# Patient Record
Sex: Female | Born: 1980
Health system: Southern US, Community
[De-identification: ages and names within clinical notes are randomized; demographics above are authoritative.]

## PROBLEM LIST (undated history)

## (undated) DIAGNOSIS — F419 Anxiety disorder, unspecified: Secondary | ICD-10-CM

## (undated) DIAGNOSIS — Z973 Presence of spectacles and contact lenses: Secondary | ICD-10-CM

## (undated) DIAGNOSIS — F32A Depression, unspecified: Secondary | ICD-10-CM

## (undated) DIAGNOSIS — F329 Major depressive disorder, single episode, unspecified: Secondary | ICD-10-CM

## (undated) DIAGNOSIS — G43909 Migraine, unspecified, not intractable, without status migrainosus: Secondary | ICD-10-CM

## (undated) DIAGNOSIS — E079 Disorder of thyroid, unspecified: Secondary | ICD-10-CM

## (undated) HISTORY — DX: Depression, unspecified: F32.A

## (undated) HISTORY — PX: GALLBLADDER SURGERY: SHX652

## (undated) HISTORY — PX: CHOLECYSTECTOMY: SHX55

## (undated) HISTORY — DX: Anxiety disorder, unspecified: F41.9

## (undated) HISTORY — DX: Major depressive disorder, single episode, unspecified: F32.9

---

## 2004-07-17 ENCOUNTER — Observation Stay: Payer: Self-pay

## 2004-08-20 ENCOUNTER — Inpatient Hospital Stay: Payer: Self-pay

## 2011-10-11 ENCOUNTER — Ambulatory Visit: Payer: Self-pay | Admitting: Physician Assistant

## 2012-01-15 ENCOUNTER — Emergency Department: Payer: Self-pay | Admitting: Emergency Medicine

## 2012-01-15 LAB — URINALYSIS, COMPLETE
Bilirubin,UR: NEGATIVE
Blood: NEGATIVE
Ketone: NEGATIVE
Leukocyte Esterase: NEGATIVE
Specific Gravity: 1.014 (ref 1.003–1.030)
Squamous Epithelial: 1
WBC UR: 4 /HPF (ref 0–5)

## 2012-01-15 LAB — COMPREHENSIVE METABOLIC PANEL
Albumin: 3.9 g/dL (ref 3.4–5.0)
Alkaline Phosphatase: 236 U/L — ABNORMAL HIGH (ref 50–136)
Calcium, Total: 8.8 mg/dL (ref 8.5–10.1)
Co2: 27 mmol/L (ref 21–32)
EGFR (Non-African Amer.): >60
Glucose: 109 mg/dL — ABNORMAL HIGH (ref 65–99)
Osmolality: 275 (ref 275–301)
SGOT(AST): 39 U/L — ABNORMAL HIGH (ref 15–37)
SGPT (ALT): 62 U/L (ref 12–78)
Sodium: 138 mmol/L (ref 136–145)

## 2012-01-15 LAB — CBC
HGB: 13.8 g/dL (ref 12.0–16.0)
MCH: 31 pg (ref 26.0–34.0)
MCHC: 34.1 g/dL (ref 32.0–36.0)
MCV: 91 fL (ref 80–100)
RDW: 12.7 % (ref 11.5–14.5)

## 2012-02-10 ENCOUNTER — Ambulatory Visit: Payer: Self-pay | Admitting: Surgery

## 2012-02-10 LAB — CBC WITH DIFFERENTIAL/PLATELET
Basophil #: 0.1 10*3/uL (ref 0.0–0.1)
Basophil %: 1.3 %
HCT: 41.6 % (ref 35.0–47.0)
Lymphocyte #: 2.3 10*3/uL (ref 1.0–3.6)
Lymphocyte %: 21.7 %
MCH: 31 pg (ref 26.0–34.0)
MCHC: 34.8 g/dL (ref 32.0–36.0)
MCV: 89 fL (ref 80–100)
Neutrophil #: 6.9 10*3/uL — ABNORMAL HIGH (ref 1.4–6.5)
Platelet: 346 10*3/uL (ref 150–440)
RDW: 12.7 % (ref 11.5–14.5)
WBC: 10.8 10*3/uL (ref 3.6–11.0)

## 2012-02-10 LAB — COMPREHENSIVE METABOLIC PANEL
Alkaline Phosphatase: 150 U/L — ABNORMAL HIGH (ref 50–136)
Anion Gap: 11 (ref 7–16)
BUN: 11 mg/dL (ref 7–18)
Calcium, Total: 8.6 mg/dL (ref 8.5–10.1)
Co2: 21 mmol/L (ref 21–32)
Creatinine: 0.7 mg/dL (ref 0.60–1.30)
EGFR (Non-African Amer.): >60
Glucose: 98 mg/dL (ref 65–99)
Potassium: 3.6 mmol/L (ref 3.5–5.1)
Sodium: 139 mmol/L (ref 136–145)

## 2012-02-11 LAB — PATHOLOGY REPORT

## 2014-06-11 NOTE — H&P (Signed)
PATIENT NAME:  Vickie OxfordFOGLEMAN, Raquel E MR#:  [id number] DATE OF BIRTH:  1980/03/24  DATE OF ADMISSION:  02/10/2012  DATE OF OPERATION:  02/10/2012  CHIEF COMPLAINT:  Right upper quadrant pain.   HISTORY OF PRESENT ILLNESS:  This is a 34 year old Caucasian female patient who has had multiple episodes of right upper quadrant pain associated with fatty food intolerance. Her workup shows gallstones without acute cholecystitis. The patient is actually a patient of Dr. Marlowe KaysEly's and has been seeing him for consideration of surgery. Dr. Michela PitcherEly asked me to perform the operation as he is not available on the specific day that the patient requested surgery. I will meet the patient in the preop holding area and go over the history and physical as well. This history and physical is being dictated from Dr. Marlowe KaysEly's office notes.   PAST MEDICAL HISTORY:  None.   PAST SURGICAL HISTORY:  None.   ALLERGIES:  None.   MEDICATIONS:  None.   SOCIAL HISTORY:  The patient does not smoke or drink.   REVIEW OF SYSTEMS:  A 10 system review is performed and negative with the exception of that mentioned in the HPI and documented in Dr. Marlowe KaysEly's chart.   PHYSICAL EXAMINATION: GENERAL: Healthy-appearing female patient, BMI is 30.  VITAL SIGNS: Stable.  HEENT: No scleral icterus.  NECK: No palpable neck nodes.  CHEST: Clear to auscultation.  CARDIAC: Regular rate and rhythm.  ABDOMEN: Soft, nontender.  EXTREMITIES: Without edema. Calves are nontender.  NEUROLOGIC: Grossly intact.  INTEGUMENT: No jaundice.   LABORATORY, DIAGNOSTIC AND RADIOLOGICAL DATA:  An ultrasound shows gallstones. A HIDA scan was normal without an obstructed duct. Recent laboratory values demonstrate a white blood cell count of 11.8, H and H of 13.8 and 40 with a platelet count of 319. AST and ALT are within normal limits with an elevated alkaline phosphatase.   ASSESSMENT AND PLAN:  This is a patient with symptomatic cholelithiasis. She has had multiple  attacks. Dr. Michela PitcherEly has asked me to perform surgery in his absence. The rationale for this has been discussed between Dr. Michela PitcherEly and myself. Dr. Michela PitcherEly has reviewed the options and the risks of bleeding and infection, etc., and I will discuss those same options, rationale and risks with the patient in the preop holding area.    ____________________________ Adah Salvageichard E. Excell Seltzerooper, MD rec:si D: 02/09/2012 22:20:00 ET T: 02/09/2012 23:28:16 ET JOB#: [id number]  cc: Adah Salvageichard E. Excell Seltzerooper, MD, <Dictator> Lattie HawICHARD E Camari Wisham MD ELECTRONICALLY SIGNED 02/10/2012 7:34

## 2014-06-11 NOTE — Op Note (Signed)
PATIENT NAME:  Vickie Gilbert, Vickie Gilbert MR#:  [id number] DATE OF BIRTH:  [birth year]  DATE OF PROCEDURE:  02/10/2012  PREOPERATIVE DIAGNOSIS: Symptomatic cholelithiasis.   POSTOPERATIVE DIAGNOSIS: Symptomatic cholelithiasis.   PROCEDURE: Laparoscopic cholecystectomy.   SURGEON: Phoebe Perch, M.D.   ANESTHESIA: General with endotracheal tube.   INDICATIONS: This is a patient with recurrent episodes of right upper quadrant pain associated with fatty food intolerance and nausea and a workup showing gallstones. Dr. Pat Patrick had seen the patient in the office and asked that I perform surgery in his absence. I met the patient in the preop holding area and we discussed the rationale for surgery, the options of observation, and the risks of bleeding, infection, recurrence of symptoms, failure to resolve her symptoms, open procedure, bile duct damage, bile duct leak and retained common bile duct stone any of which could require further surgery and/or ERCP, stent and papillotomy. This was all reviewed for she and her husband. They understood and agreed to proceed.   FINDINGS: Small gallstones, scar on the anterior surface of the gallbladder.   DESCRIPTION OF PROCEDURE: The patient was induced to general anesthesia. VTE prophylaxis was in place. IV antibiotics were given. She was then prepped and draped in sterile fashion. Marcaine was infiltrated in skin and subcutaneous tissues around the supraumbilical area avoiding a prior piercing. An incision was made, Veress needle was placed, pneumoperitoneum was obtained and a 5 mm trocar port was placed. The abdominal cavity was explored and under direct vision a 10 mm epigastric port and two lateral 5 mm ports were placed. The gallbladder was placed on tension. The peritoneum over the infundibulum was incised bluntly after taking down some adhesions bluntly. The cystic duct/gallbladder junction was well identified. The cystic artery was doubly clipped and divided in one branch  to allow for good visualization of the cystic duct as it entered the infundibulum of the gallbladder. Here it was doubly clipped and divided and then a second branch of the cystic artery was doubly clipped and divided and the gallbladder was taken from the gallbladder fossa with electrocautery and passed out through the epigastric port site with the aid of an Endo Catch bag. The area was checked for hemostasis and found to be adequate. There was no sign of bleeding, bile leak or bowel injury. The camera was placed in the epigastric site to view back to the periumbilical site. There was no sign of adhesions or bowel injury. Therefore, pneumoperitoneum was released, all ports were removed, fascial edges of the epigastric site were approximated with 0 Vicryl and 4-0 subcuticular Monocryl was used on all skin edges and Steri-Strips, Mastisol, and sterile dressings were placed.   The patient tolerated the procedure well. There were no complications. She was taken to the recovery room in stable condition to be discharged in the care of her family and follow up in 10 days. ____________________________ Jerrol Banana Burt Knack, MD rec:sb D: 02/10/2012 08:01:00 ET T: 02/10/2012 14:27:23 ET JOB#: [id number]  cc: Jerrol Banana. Burt Knack, MD, <Dictator> Florene Glen MD ELECTRONICALLY SIGNED 02/10/2012 15:22

## 2015-08-18 ENCOUNTER — Other Ambulatory Visit: Payer: Self-pay | Admitting: *Deleted

## 2015-08-18 MED ORDER — ESCITALOPRAM OXALATE 10 MG PO TABS: 10.0000 mg | ORAL_TABLET | Freq: Every day | ORAL | Status: DC

## 2015-11-06 ENCOUNTER — Other Ambulatory Visit: Payer: Self-pay | Admitting: Obstetrics and Gynecology

## 2015-11-20 ENCOUNTER — Telehealth: Payer: Self-pay | Admitting: Obstetrics and Gynecology

## 2015-11-20 NOTE — Telephone Encounter (Signed)
SHE IS OUT OF THE LEXIPRO BUT SHE WOULD LIKE TO DISCUSS MAYBE CHANGING IT, SHE DIDN'T KNOW IF SHE NEEDED TO MAKE AN APPT OR IF YOU AND HER CAN JUST TALK OVER THE PHONE. SHE STATED THAT HER SISTER IS ON [employer] AND HER SISTER COMES HERE, AND SHE TALKED TO HER AND THEY HAVE THE SAME THING THAT IS GOING ON , AND HER PTS ANXIETY IS GETTING WORSE SO SHE THINKS THAT THE LEXIPRO IS NOT WORKING MAYBE MAKING THINGS WORSE.

## 2015-11-25 NOTE — Telephone Encounter (Signed)
Called pt advised she will need appt, she voiced understanding

## 2015-11-25 NOTE — Telephone Encounter (Signed)
Vickie AddisonKatie called again about her medication.

## 2015-11-25 NOTE — Telephone Encounter (Signed)
She really needs to make an appointment. Doesn't look like we have seen her since on Epic.

## 2015-11-28 ENCOUNTER — Encounter: Payer: Self-pay | Admitting: Obstetrics and Gynecology

## 2015-11-28 ENCOUNTER — Ambulatory Visit (INDEPENDENT_AMBULATORY_CARE_PROVIDER_SITE_OTHER): Payer: Managed Care, Other (non HMO) | Admitting: Obstetrics and Gynecology

## 2015-11-28 VITALS — BP 114/79 | HR 83 | Ht 67.0 in | Wt 215.4 lb

## 2015-11-28 DIAGNOSIS — G479 Sleep disorder, unspecified: Secondary | ICD-10-CM | POA: Diagnosis not present

## 2015-11-28 DIAGNOSIS — F410 Panic disorder [episodic paroxysmal anxiety] without agoraphobia: Secondary | ICD-10-CM

## 2015-11-28 DIAGNOSIS — F331 Major depressive disorder, recurrent, moderate: Secondary | ICD-10-CM | POA: Diagnosis not present

## 2015-11-28 MED ORDER — VENLAFAXINE HCL ER 37.5 MG PO CP24: 37.5000 mg | ORAL_CAPSULE | Freq: Every day | ORAL | 4 refills | Status: DC

## 2015-11-28 MED ORDER — TRIAZOLAM 0.25 MG PO TABS: 0.2500 mg | ORAL_TABLET | Freq: Every evening | ORAL | 4 refills | Status: DC | PRN

## 2015-11-28 NOTE — Progress Notes (Signed)
Subjective:     Patient ID: Vickie Gilbert, female   DOB: 01-28-1981, 35 y.o.   MRN: [id number]  HPI Thought Lexapro wasn't working, but ran out of medication 3 weeks and now realizes it was helping some. But still feels like anxiety wasn't helped by it. Having panic attacks daily. Crying daily, and most of the time not related to stressors. Husband is supportive. Took Lexapro for 3 years postpartum, and then for 2 years currently. Has autoimmune disorder like RA but not actually it, all symptoms are flared up and making her feel worse.  Review of Systems Joint pain,  Malaise, no strength, dizzy, migraines, or generalized headaches,extremeties shaking; Depression screen PHQ 2/9 11/28/2015  Decreased Interest 3  Down, Depressed, Hopeless 3  PHQ - 2 Score 6  Altered sleeping 3  Tired, decreased energy 2  Change in appetite 2  Feeling bad or failure about yourself  2  Trouble concentrating 2  Moving slowly or fidgety/restless 2  Suicidal thoughts 0  PHQ-9 Score 19      Objective:   Physical Exam A&O x4 Well groomed female in distress, crying and frustrated Blood pressure 114/79, pulse 83, height 5\' 7"  (1.702 m), weight 215 lb 6.4 oz (97.7 kg), last menstrual period 11/05/2015. Thought processes intact but scattered     Assessment:     Depression with anxiety Panic attacked Sleep disturbance     Plan:     effexor 37.5mg  started, along with triazolam 0.25mg  at bedtime. Counseled on treatment and side effects and expected outcome. RTC 5 weeks for follow up or as needed.  >50% of 20 minutes spent in counseling.  Carmin Dibartolo BurdettShambley, CNM

## 2015-11-28 NOTE — Patient Instructions (Signed)
Thank you for enrolling in MyChart. Please follow the instructions below to securely access your online medical record. MyChart allows you to send messages to your doctor, view your test results, renew your prescriptions, schedule appointments, and more.  How Do I Sign Up? 1. In your Internet browser, go to [email or web address] 2. Click on the New  User? link in the Sign In box.  3. Enter your MyChart Access Code exactly as it appears below. You will not need to use this code after you have completed the sign-up process. If you do not sign up before the expiration date, you must request a new code. MyChart Access Code: [email or web address] Expires: 01/27/2016  3:51 PM  4. Enter the last four digits of your Social Security Number (xxxx) and Date of Birth (mm/dd/yyyy) as indicated and click Next. You will be taken to the next sign-up page. 5. Create a MyChart ID. This will be your MyChart login ID and cannot be changed, so think of one that is secure and easy to remember. 6. Create a MyChart password. You can change your password at any time. 7. Enter your Password Reset Question and Answer and click Next. This can be used at a later time if you forget your password.  8. Select your communication preference, and if applicable enter your e-mail address. You will receive e-mail notification when new information is available in MyChart by choosing to receive e-mail notifications and filling in your e-mail. 9. Click Sign In. You can now view your medical record.   Additional Information If you have questions, you can email [email or web address] or call [phone] to talk to our MyChart staff. Remember, MyChart is NOT to be used for urgent needs. For medical emergencies, dial 911.     Major Depressive Disorder Major depressive disorder is a mental illness. It also may be called clinical depression or unipolar depression. Major depressive disorder usually causes feelings of  sadness, hopelessness, or helplessness. Some people with this disorder do not feel particularly sad but lose interest in doing things they used to enjoy (anhedonia). Major depressive disorder also can cause physical symptoms. It can interfere with work, school, relationships, and other normal everyday activities. The disorder varies in severity but is longer lasting and more serious than the sadness we all feel from time to time in our lives. Major depressive disorder often is triggered by stressful life events or major life changes. Examples of these triggers include divorce, loss of your job or home, a move, and the death of a family member or close friend. Sometimes this disorder occurs for no obvious reason at all. People who have family members with major depressive disorder or bipolar disorder are at higher risk for developing this disorder, with or without life stressors. Major depressive disorder can occur at any age. It may occur just once in your life (single episode major depressive disorder). It may occur multiple times (recurrent major depressive disorder). SYMPTOMS People with major depressive disorder have either anhedonia or depressed mood on nearly a daily basis for at least 2 weeks or longer. Symptoms of depressed mood include:  Feelings of sadness (blue or down in the dumps) or emptiness.  Feelings of hopelessness or helplessness.  Tearfulness or episodes of crying (may be observed by others).  Irritability (children and adolescents). In addition to depressed mood or anhedonia or both, people with this disorder have at least four of the following symptoms:  Difficulty sleeping or sleeping too much.  Significant change (increase or decrease) in appetite or weight.   Lack of energy or motivation.  Feelings of guilt and worthlessness.   Difficulty concentrating, remembering, or making decisions.  Unusually slow movement (psychomotor retardation) or restlessness (as  observed by others).   Recurrent wishes for death, recurrent thoughts of self-harm (suicide), or a suicide attempt. People with major depressive disorder commonly have persistent negative thoughts about themselves, other people, and the world. People with severe major depressive disorder may experiencedistorted beliefs or perceptions about the world (psychotic delusions). They also may see or hear things that are not real (psychotic hallucinations). DIAGNOSIS Major depressive disorder is diagnosed through an assessment by your health care provider. Your health care provider will ask aboutaspects of your daily life, such as mood,sleep, and appetite, to see if you have the diagnostic symptoms of major depressive disorder. Your health care provider may ask about your medical history and use of alcohol or drugs, including prescription medicines. Your health care provider also may do a physical exam and blood work. This is because certain medical conditions and the use of certain substances can cause major depressive disorder-like symptoms (secondary depression). Your health care provider also may refer you to a mental health specialist for further evaluation and treatment. TREATMENT It is important to recognize the symptoms of major depressive disorder and seek treatment. The following treatments can be prescribed for this disorder:   Medicine. Antidepressant medicines usually are prescribed. Antidepressant medicines are thought to correct chemical imbalances in the brain that are commonly associated with major depressive disorder. Other types of medicine may be added if the symptoms do not respond to antidepressant medicines alone or if psychotic delusions or hallucinations occur.  Talk therapy. Talk therapy can be helpful in treating major depressive disorder by providing support, education, and guidance. Certain types of talk therapy also can help with negative thinking (cognitive behavioral therapy)  and with relationship issues that trigger this disorder (interpersonal therapy). A mental health specialist can help determine which treatment is best for you. Most people with major depressive disorder do well with a combination of medicine and talk therapy. Treatments involving electrical stimulation of the brain can be used in situations with extremely severe symptoms or when medicine and talk therapy do not work over time. These treatments include electroconvulsive therapy, transcranial magnetic stimulation, and vagal nerve stimulation.   This information is not intended to replace advice given to you by your health care provider. Make sure you discuss any questions you have with your health care provider.   Document Released: 06/05/2012 Document Revised: 03/01/2014 Document Reviewed: 06/05/2012 Elsevier Interactive Patient Education 2016 Elsevier Inc. Generalized Anxiety Disorder Generalized anxiety disorder (GAD) is a mental disorder. It interferes with life functions, including relationships, work, and school. GAD is different from normal anxiety, which everyone experiences at some point in their lives in response to specific life events and activities. Normal anxiety actually helps [place] prepare for and get through these life events and activities. Normal anxiety goes away after the event or activity is over.  GAD causes anxiety that is not necessarily related to specific events or activities. It also causes excess anxiety in proportion to specific events or activities. The anxiety associated with GAD is also difficult to control. GAD can vary from mild to severe. People with severe GAD can have intense waves of anxiety with physical symptoms (panic attacks).  SYMPTOMS The anxiety and worry associated with GAD are difficult to control. This anxiety and worry are related to  many life events and activities and also occur more days than not for 6 months or longer. People with GAD also have three or  more of the following symptoms (one or more in children):  Restlessness.   Fatigue.  Difficulty concentrating.   Irritability.  Muscle tension.  Difficulty sleeping or unsatisfying sleep. DIAGNOSIS GAD is diagnosed through an assessment by your health care provider. Your health care provider will ask you questions aboutyour mood,physical symptoms, and events in your life. Your health care provider may ask you about your medical history and use of alcohol or drugs, including prescription medicines. Your health care provider may also do a physical exam and blood tests. Certain medical conditions and the use of certain substances can cause symptoms similar to those associated with GAD. Your health care provider may refer you to a mental health specialist for further evaluation. TREATMENT The following therapies are usually used to treat GAD:   Medication. Antidepressant medication usually is prescribed for long-term daily control. Antianxiety medicines may be added in severe cases, especially when panic attacks occur.   Talk therapy (psychotherapy). Certain types of talk therapy can be helpful in treating GAD by providing support, education, and guidance. A form of talk therapy called cognitive behavioral therapy can teach you healthy ways to think about and react to daily life events and activities.  Stress managementtechniques. These include yoga, meditation, and exercise and can be very helpful when they are practiced regularly. A mental health specialist can help determine which treatment is best for you. Some people see improvement with one therapy. However, other people require a combination of therapies.   This information is not intended to replace advice given to you by your health care provider. Make sure you discuss any questions you have with your health care provider.   Document Released: 06/05/2012 Document Revised: 03/01/2014 Document Reviewed: 06/05/2012 Elsevier  Interactive Patient Education [employer]2016 Elsevier Inc.

## 2015-12-31 ENCOUNTER — Other Ambulatory Visit: Payer: Self-pay | Admitting: Obstetrics and Gynecology

## 2016-01-02 ENCOUNTER — Ambulatory Visit (INDEPENDENT_AMBULATORY_CARE_PROVIDER_SITE_OTHER): Payer: Managed Care, Other (non HMO) | Admitting: Obstetrics and Gynecology

## 2016-01-02 ENCOUNTER — Encounter: Payer: Self-pay | Admitting: Obstetrics and Gynecology

## 2016-01-02 VITALS — BP 109/76 | HR 85 | Wt 211.8 lb

## 2016-01-02 DIAGNOSIS — Z79899 Other long term (current) drug therapy: Secondary | ICD-10-CM

## 2016-01-02 NOTE — Progress Notes (Signed)
Subjective:     Patient ID: Vickie Gilbert, female   DOB: 09-30-1980, 35 y.o.   MRN: [id number]  HPI Patient reports feeling about 80% better on Effexor. States that she remains calm more and has more patience and is not as "reactive" as she was before. Feels like her life is more manageable. Coping skills are better and she is handling her stress better.   Sleeps better with triazolam and is feeling much more rested in the morning. Not only making a mental difference but states she feels better physically as well.   Review of Systems  All systems negative except what is mentioned above. [facility] screen [facility] 2/9 01/02/2016 11/28/2015  Decreased Interest 1 3  Down, Depressed, Hopeless 1 3  PHQ - 2 Score 2 6  Altered sleeping 0 3  Tired, decreased energy 0 2  Change in appetite 0 2  Feeling bad or failure about yourself  1 2  Trouble concentrating 0 2  Moving slowly or fidgety/restless 0 2  Suicidal thoughts 0 0  PHQ-9 Score 3 19      Objective:   Physical Exam Blood pressure 109/76, pulse 85, weight 211 lb 12.8 oz (96.1 kg), last menstrual period 12/12/2015. A&O x4 Well groomed, pleasant female. Appropriate affect. No PD indicated.    Assessment:     Anxiety and Depression under good control with current SNRI Sleep Disturbances improved with current medication    Plan:     Will continue medications as ordered.  Follow up as needed  Melody Silver FirsShambley, CNN

## 2016-12-23 ENCOUNTER — Encounter: Payer: Self-pay | Admitting: Obstetrics and Gynecology

## 2016-12-23 ENCOUNTER — Other Ambulatory Visit: Payer: Self-pay | Admitting: Obstetrics and Gynecology

## 2016-12-23 ENCOUNTER — Ambulatory Visit (INDEPENDENT_AMBULATORY_CARE_PROVIDER_SITE_OTHER): Payer: 59 | Admitting: Obstetrics and Gynecology

## 2016-12-23 VITALS — BP 104/79 | HR 63 | Ht 67.0 in | Wt 191.8 lb

## 2016-12-23 DIAGNOSIS — Z23 Encounter for immunization: Secondary | ICD-10-CM | POA: Diagnosis not present

## 2016-12-23 DIAGNOSIS — Z01419 Encounter for gynecological examination (general) (routine) without abnormal findings: Secondary | ICD-10-CM | POA: Diagnosis not present

## 2016-12-23 DIAGNOSIS — E669 Obesity, unspecified: Secondary | ICD-10-CM

## 2016-12-23 DIAGNOSIS — L309 Dermatitis, unspecified: Secondary | ICD-10-CM | POA: Diagnosis not present

## 2016-12-23 MED ORDER — CLOBETASOL PROPIONATE 0.05 % EX OINT: 1.0000 "application " | TOPICAL_OINTMENT | Freq: Two times a day (BID) | CUTANEOUS | 2 refills | Status: DC

## 2016-12-23 MED ORDER — FLUOXETINE HCL 10 MG PO CAPS: 10.0000 mg | ORAL_CAPSULE | Freq: Every day | ORAL | 3 refills | Status: DC

## 2016-12-23 NOTE — Patient Instructions (Signed)
Preventive Care 18-39 Years, Female Preventive care refers to lifestyle choices and visits with your health care provider that can promote health and wellness. What does preventive care include?  A yearly physical exam. This is also called an annual well check.  Dental exams once or twice a year.  Routine eye exams. Ask your health care provider how often you should have your eyes checked.  Personal lifestyle choices, including: ? Daily care of your teeth and gums. ? Regular physical activity. ? Eating a healthy diet. ? Avoiding tobacco and drug use. ? Limiting alcohol use. ? Practicing safe sex. ? Taking vitamin and mineral supplements as recommended by your health care provider. What happens during an annual well check? The services and screenings done by your health care provider during your annual well check will depend on your age, overall health, lifestyle risk factors, and family history of disease. Counseling Your health care provider may ask you questions about your:  Alcohol use.  Tobacco use.  Drug use.  Emotional well-being.  Home and relationship well-being.  Sexual activity.  Eating habits.  Work and work Statistician.  Method of birth control.  Menstrual cycle.  Pregnancy history.  Screening You may have the following tests or measurements:  Height, weight, and BMI.  Diabetes screening. This is done by checking your blood sugar (glucose) after you have not eaten for a while (fasting).  Blood pressure.  Lipid and cholesterol levels. These may be checked every 5 years starting at age 38.  Skin check.  Hepatitis C blood test.  Hepatitis B blood test.  Sexually transmitted disease (STD) testing.  BRCA-related cancer screening. This may be done if you have a family history of breast, ovarian, tubal, or peritoneal cancers.  Pelvic exam and Pap test. This may be done every 3 years starting at age 38. Starting at age 30, this may be done  every 5 years if you have a Pap test in combination with an HPV test.  Discuss your test results, treatment options, and if necessary, the need for more tests with your health care provider. Vaccines Your health care provider may recommend certain vaccines, such as:  Influenza vaccine. This is recommended every year.  Tetanus, diphtheria, and acellular pertussis (Tdap, Td) vaccine. You may need a Td booster every 10 years.  Varicella vaccine. You may need this if you have not been vaccinated.  HPV vaccine. If you are 39 or younger, you may need three doses over 6 months.  Measles, mumps, and rubella (MMR) vaccine. You may need at least one dose of MMR. You may also need a second dose.  Pneumococcal 13-valent conjugate (PCV13) vaccine. You may need this if you have certain conditions and were not previously vaccinated.  Pneumococcal polysaccharide (PPSV23) vaccine. You may need one or two doses if you smoke cigarettes or if you have certain conditions.  Meningococcal vaccine. One dose is recommended if you are age 68-21 years and a first-year college student living in a residence hall, or if you have one of several medical conditions. You may also need additional booster doses.  Hepatitis A vaccine. You may need this if you have certain conditions or if you travel or work in places where you may be exposed to hepatitis A.  Hepatitis B vaccine. You may need this if you have certain conditions or if you travel or work in places where you may be exposed to hepatitis B.  Haemophilus influenzae type b (Hib) vaccine. You may need this  if you have certain risk factors.  Talk to your health care provider about which screenings and vaccines you need and how often you need them. This information is not intended to replace advice given to you by your health care provider. Make sure you discuss any questions you have with your health care provider. Document Released: 04/06/2001 Document Revised:  10/29/2015 Document Reviewed: 12/10/2014 Elsevier Interactive Patient Education  2017 Elsevier Inc.  

## 2016-12-23 NOTE — Addendum Note (Signed)
Addended by: Rosine BeatLONTZ, AMY L on: 12/23/2016 11:48 AM   Modules accepted: Orders

## 2016-12-23 NOTE — Progress Notes (Signed)
Subjective:   Vickie Gilbert is a 36 y.o. 483P0 Caucasian female here for a routine well-woman exam.  Patient's last menstrual period was 12/07/2016.    Current complaints: a lot of stress, but dealing with it well. Living in same house with parents and foster  Child. Stopped SNRI last Christmas due to panic attacks and manic episodes. Symptoms resolved. When she stopped. Does not feel like she needs medications at this time. Worse around menses.  PCP: Hyacinth MeekerMiller       does desire labs & flu vaccine  Social History: Sexual: heterosexual Marital Status: married Living situation: with family Occupation: homemaker Tobacco/alcohol: no tobacco use Illicit drugs: no history of illicit drug use  The following portions of the patient's history were reviewed and updated as appropriate: allergies, current medications, past family history, past medical history, past social history, past surgical history and problem list.  Past Medical History Past Medical History:  Diagnosis Date  . Anxiety   . Depression     Past Surgical History Past Surgical History:  Procedure Laterality Date  . GALLBLADDER SURGERY      Gynecologic History G3P0  Patient's last menstrual period was 12/07/2016. Contraception: vasectomy Last Pap: ?Marland Kitchen. Results were: normal   Obstetric History OB History  Gravida Para Term Preterm AB Living  3            SAB TAB Ectopic Multiple Live Births               # Outcome Date GA Lbr Len/2nd Weight Sex Delivery Anes PTL Lv  3 Gravida 2012    M Vag-Spont     2 Gravida 2006    F Vag-Spont     1 Gravida 2004    F Vag-Spont         Current Medications Current Outpatient Prescriptions on File Prior to Visit  Medication Sig Dispense Refill  . triazolam (HALCION) 0.25 MG tablet Take 1 tablet (0.25 mg total) by mouth at bedtime as needed for sleep. (Patient not taking: Reported on 12/23/2016) 30 tablet 4  . venlafaxine XR (EFFEXOR XR) 37.5 MG 24 hr capsule Take 1 capsule (37.5 mg  total) by mouth daily with breakfast. (Patient not taking: Reported on 12/23/2016) 30 capsule 4   No current facility-administered medications on file prior to visit.    [facility] 12/23/2016 01/02/2016 11/28/2015  Decreased Interest 1 1 3   Down, Depressed, Hopeless 1 1 3   PHQ - 2 Score 2 2 6   Altered sleeping 0 0 3  Tired, decreased energy 1 0 2  Change in appetite 1 0 2  Feeling bad or failure about yourself  0 1 2  Trouble concentrating 0 0 2  Moving slowly or fidgety/restless 0 0 2  Suicidal thoughts 0 0 0  PHQ-9 Score 4 3 19   Difficult doing work/chores Somewhat difficult - -   Review of Systems Patient denies any headaches, blurred vision, shortness of breath, chest pain, abdominal pain, problems with bowel movements, urination, or intercourse.  Objective:  BP 104/79   Pulse 63   Ht 5\' 7"  (1.702 m)   Wt 191 lb 12.8 oz (87 kg)   LMP 12/07/2016   BMI 30.04 kg/m  Physical Exam  General:  Well developed, well nourished, no acute distress. She is alert and oriented x3. Skin:  Warm and dry, eczema noted between 2nd & 3rd finger of each hand Neck:  Midline trachea, no thyromegaly or nodules Cardiovascular: Regular rate and rhythm, no  murmur heard Lungs:  Effort normal, all lung fields clear to auscultation bilaterally Breasts:  No dominant palpable mass, retraction, or nipple discharge Abdomen:  Soft, non tender, no hepatosplenomegaly or masses Pelvic:  External genitalia is normal in appearance.  The vagina is normal in appearance. The cervix is bulbous, no CMT.  Thin prep pap is done with HR HPV cotesting. Uterus is felt to be normal size, shape, and contour.  No adnexal masses or tenderness noted. Extremities:  No swelling or varicosities noted Psych:  She has a normal mood and affect  Assessment:   Healthy well-woman exam Obesity Pre-menstrual depression Eczema on hands Needs flu vaccine   Plan:  Labs obtained and will follow up accordingly. Will try  prozac 10mg  premenstrually. clobex cream ordered and instructed on use. Flu vaccine given F/U 1 year for AE, or sooner if needed   Melody Suzan Nailer, CNM

## 2016-12-24 LAB — COMPREHENSIVE METABOLIC PANEL
ALK PHOS: 120 IU/L — AB (ref 39–117)
ALT: 25 IU/L (ref 0–32)
AST: 24 IU/L (ref 0–40)
Albumin/Globulin Ratio: 1.7 (ref 1.2–2.2)
Albumin: 4.6 g/dL (ref 3.5–5.5)
BUN/Creatinine Ratio: 12 (ref 9–23)
BUN: 9 mg/dL (ref 6–20)
Bilirubin Total: 0.3 mg/dL (ref 0.0–1.2)
CALCIUM: 9.3 mg/dL (ref 8.7–10.2)
CO2: 24 mmol/L (ref 20–29)
CREATININE: 0.74 mg/dL (ref 0.57–1.00)
Chloride: 100 mmol/L (ref 96–106)
GFR calc Af Amer: 120 mL/min/{1.73_m2} (ref >59)
GFR, EST NON AFRICAN AMERICAN: 104 mL/min/{1.73_m2} (ref >59)
GLUCOSE: 89 mg/dL (ref 65–99)
Globulin, Total: 2.7 g/dL (ref 1.5–4.5)
Potassium: 4.2 mmol/L (ref 3.5–5.2)
SODIUM: 139 mmol/L (ref 134–144)
Total Protein: 7.3 g/dL (ref 6.0–8.5)

## 2016-12-24 LAB — LIPID PANEL
Chol/HDL Ratio: 3.7 ratio (ref 0.0–4.4)
Cholesterol, Total: 194 mg/dL (ref 100–199)
HDL: 52 mg/dL (ref >39)
LDL CALC: 102 mg/dL — AB (ref 0–99)
Triglycerides: 200 mg/dL — ABNORMAL HIGH (ref 0–149)
VLDL CHOLESTEROL CAL: 40 mg/dL (ref 5–40)

## 2016-12-24 LAB — THYROID PANEL WITH TSH
Free Thyroxine Index: 1.2 (ref 1.2–4.9)
T3 UPTAKE RATIO: 19 % — AB (ref 24–39)
T4 TOTAL: 6.4 ug/dL (ref 4.5–12.0)
TSH: 9.16 u[IU]/mL — AB (ref 0.450–4.500)

## 2016-12-24 LAB — CYTOLOGY - PAP

## 2016-12-31 ENCOUNTER — Telehealth: Payer: Self-pay | Admitting: *Deleted

## 2016-12-31 NOTE — Telephone Encounter (Signed)
Mailed all info to pt 

## 2016-12-31 NOTE — Telephone Encounter (Signed)
-----   Message from Purcell NailsMelody N Shambley, [place] sent at 12/24/2016 12:37 PM EDT ----- Please send copy of labs, and let her know her TSH is really high- indicating underactive thyroid. I want her to follow up with DR Paul HalfM Miller within a month.

## 2017-01-18 ENCOUNTER — Ambulatory Visit
Admission: RE | Admit: 2017-01-18 | Discharge: 2017-01-18 | Disposition: A | Discharge status: Home / Self Care | Payer: 59 | Source: Ambulatory Visit | Attending: Obstetrics and Gynecology | Admitting: Obstetrics and Gynecology

## 2017-01-18 DIAGNOSIS — Z01419 Encounter for gynecological examination (general) (routine) without abnormal findings: Secondary | ICD-10-CM | POA: Diagnosis not present

## 2017-01-18 DIAGNOSIS — R928 Other abnormal and inconclusive findings on diagnostic imaging of breast: Secondary | ICD-10-CM | POA: Diagnosis not present

## 2017-01-18 DIAGNOSIS — Z1231 Encounter for screening mammogram for malignant neoplasm of breast: Secondary | ICD-10-CM | POA: Diagnosis not present

## 2017-01-19 ENCOUNTER — Other Ambulatory Visit: Payer: Self-pay | Admitting: Obstetrics and Gynecology

## 2017-01-19 DIAGNOSIS — R928 Other abnormal and inconclusive findings on diagnostic imaging of breast: Secondary | ICD-10-CM

## 2017-01-20 ENCOUNTER — Other Ambulatory Visit: Payer: Self-pay | Admitting: Obstetrics and Gynecology

## 2017-01-20 DIAGNOSIS — R928 Other abnormal and inconclusive findings on diagnostic imaging of breast: Secondary | ICD-10-CM

## 2017-01-20 DIAGNOSIS — E039 Hypothyroidism, unspecified: Secondary | ICD-10-CM | POA: Diagnosis not present

## 2017-01-24 DIAGNOSIS — E039 Hypothyroidism, unspecified: Secondary | ICD-10-CM | POA: Diagnosis not present

## 2017-01-27 ENCOUNTER — Ambulatory Visit
Admission: RE | Admit: 2017-01-27 | Discharge: 2017-01-27 | Disposition: A | Discharge status: Home / Self Care | Payer: 59 | Source: Ambulatory Visit | Attending: Obstetrics and Gynecology | Admitting: Obstetrics and Gynecology

## 2017-01-27 ENCOUNTER — Other Ambulatory Visit: Payer: Self-pay | Admitting: Obstetrics and Gynecology

## 2017-01-27 DIAGNOSIS — R928 Other abnormal and inconclusive findings on diagnostic imaging of breast: Secondary | ICD-10-CM | POA: Diagnosis not present

## 2017-01-27 DIAGNOSIS — R922 Inconclusive mammogram: Secondary | ICD-10-CM | POA: Diagnosis not present

## 2017-02-02 ENCOUNTER — Ambulatory Visit
Admission: RE | Admit: 2017-02-02 | Discharge: 2017-02-02 | Disposition: A | Discharge status: Home / Self Care | Payer: 59 | Source: Ambulatory Visit | Attending: Obstetrics and Gynecology | Admitting: Obstetrics and Gynecology

## 2017-02-02 DIAGNOSIS — R928 Other abnormal and inconclusive findings on diagnostic imaging of breast: Secondary | ICD-10-CM

## 2017-02-02 DIAGNOSIS — N6489 Other specified disorders of breast: Secondary | ICD-10-CM | POA: Diagnosis not present

## 2017-02-02 DIAGNOSIS — N6032 Fibrosclerosis of left breast: Secondary | ICD-10-CM | POA: Diagnosis not present

## 2017-02-02 DIAGNOSIS — N6042 Mammary duct ectasia of left breast: Secondary | ICD-10-CM | POA: Insufficient documentation

## 2017-02-02 HISTORY — PX: BREAST BIOPSY: SHX20

## 2017-02-03 LAB — SURGICAL PATHOLOGY

## 2017-02-04 ENCOUNTER — Other Ambulatory Visit: Payer: Self-pay | Admitting: *Deleted

## 2017-02-04 ENCOUNTER — Telehealth: Payer: Self-pay | Admitting: *Deleted

## 2017-02-04 DIAGNOSIS — R928 Other abnormal and inconclusive findings on diagnostic imaging of breast: Secondary | ICD-10-CM

## 2017-02-04 NOTE — Telephone Encounter (Signed)
[facility]. She did a biopsy on the patient on 02/02/17 and the patient will need surgery referral. Please call Joann back,. Her contact number is [phone]. Please advise. Thank you

## 2017-02-04 NOTE — Telephone Encounter (Signed)
Spoke with Chyrl CivatteJoann, referral put in

## 2017-02-07 ENCOUNTER — Ambulatory Visit: Payer: Self-pay | Admitting: General Surgery

## 2017-02-10 ENCOUNTER — Encounter: Payer: Self-pay | Admitting: *Deleted

## 2017-03-02 ENCOUNTER — Telehealth: Payer: Self-pay | Admitting: *Deleted

## 2017-03-02 NOTE — Telephone Encounter (Signed)
Patient states she would like another referral to a general surgeon for a consult. [facility] Vickie CapriceKatherine Gilbert . Patient states if you have any questions please feel free to contact. Her contact # is [phone]. Please advise. Thank you

## 2017-03-03 DIAGNOSIS — E039 Hypothyroidism, unspecified: Secondary | ICD-10-CM | POA: Diagnosis not present

## 2017-03-07 ENCOUNTER — Telehealth: Payer: Self-pay | Admitting: *Deleted

## 2017-03-07 DIAGNOSIS — R928 Other abnormal and inconclusive findings on diagnostic imaging of breast: Secondary | ICD-10-CM

## 2017-03-07 NOTE — Telephone Encounter (Signed)
Referral was put in  

## 2017-03-07 NOTE — Telephone Encounter (Signed)
Pt would like referral to [facility] [place]

## 2017-03-25 HISTORY — PX: BREAST LUMPECTOMY: SHX2

## 2017-03-29 DIAGNOSIS — N6489 Other specified disorders of breast: Secondary | ICD-10-CM | POA: Diagnosis not present

## 2017-03-29 DIAGNOSIS — Z9189 Other specified personal risk factors, not elsewhere classified: Secondary | ICD-10-CM | POA: Diagnosis not present

## 2017-03-29 DIAGNOSIS — N6322 Unspecified lump in the left breast, upper inner quadrant: Secondary | ICD-10-CM | POA: Diagnosis not present

## 2017-04-15 DIAGNOSIS — N6320 Unspecified lump in the left breast, unspecified quadrant: Secondary | ICD-10-CM | POA: Diagnosis not present

## 2017-04-15 DIAGNOSIS — N6322 Unspecified lump in the left breast, upper inner quadrant: Secondary | ICD-10-CM | POA: Diagnosis not present

## 2017-04-15 DIAGNOSIS — N6082 Other benign mammary dysplasias of left breast: Secondary | ICD-10-CM | POA: Diagnosis not present

## 2017-04-15 DIAGNOSIS — N6489 Other specified disorders of breast: Secondary | ICD-10-CM | POA: Diagnosis not present

## 2017-04-15 DIAGNOSIS — J45909 Unspecified asthma, uncomplicated: Secondary | ICD-10-CM | POA: Diagnosis not present

## 2017-04-22 DIAGNOSIS — Z87442 Personal history of urinary calculi: Secondary | ICD-10-CM

## 2017-04-22 HISTORY — DX: Personal history of urinary calculi: Z87.442

## 2017-04-26 ENCOUNTER — Emergency Department: Payer: 59

## 2017-04-26 ENCOUNTER — Emergency Department
Admission: EM | Admit: 2017-04-26 | Discharge: 2017-04-26 | Disposition: A | Discharge status: Home / Self Care | Payer: 59 | Attending: Emergency Medicine | Admitting: Emergency Medicine

## 2017-04-26 DIAGNOSIS — E039 Hypothyroidism, unspecified: Secondary | ICD-10-CM | POA: Diagnosis not present

## 2017-04-26 DIAGNOSIS — R109 Unspecified abdominal pain: Secondary | ICD-10-CM | POA: Diagnosis not present

## 2017-04-26 DIAGNOSIS — N200 Calculus of kidney: Secondary | ICD-10-CM | POA: Diagnosis not present

## 2017-04-26 DIAGNOSIS — R1084 Generalized abdominal pain: Secondary | ICD-10-CM | POA: Diagnosis present

## 2017-04-26 DIAGNOSIS — Z79899 Other long term (current) drug therapy: Secondary | ICD-10-CM | POA: Diagnosis not present

## 2017-04-26 HISTORY — DX: Disorder of thyroid, unspecified: E07.9

## 2017-04-26 LAB — COMPREHENSIVE METABOLIC PANEL
ALBUMIN: 4.1 g/dL (ref 3.5–5.0)
ALT: 23 U/L (ref 14–54)
AST: 34 U/L (ref 15–41)
Alkaline Phosphatase: 98 U/L (ref 38–126)
Anion gap: 12 (ref 5–15)
BUN: 12 mg/dL (ref 6–20)
CHLORIDE: 103 mmol/L (ref 101–111)
CO2: 21 mmol/L — AB (ref 22–32)
CREATININE: 0.9 mg/dL (ref 0.44–1.00)
Calcium: 9.1 mg/dL (ref 8.9–10.3)
GFR calc Af Amer: >60 mL/min (ref >60)
GFR calc non Af Amer: >60 mL/min (ref >60)
Glucose, Bld: 125 mg/dL — ABNORMAL HIGH (ref 65–99)
POTASSIUM: 3.3 mmol/L — AB (ref 3.5–5.1)
Sodium: 136 mmol/L (ref 135–145)
Total Bilirubin: 0.7 mg/dL (ref 0.3–1.2)
Total Protein: 7.6 g/dL (ref 6.5–8.1)

## 2017-04-26 LAB — URINALYSIS, COMPLETE (UACMP) WITH MICROSCOPIC
BACTERIA UA: NONE SEEN
BILIRUBIN URINE: NEGATIVE
Glucose, UA: NEGATIVE mg/dL
Ketones, ur: NEGATIVE mg/dL
LEUKOCYTES UA: NEGATIVE
NITRITE: NEGATIVE
PROTEIN: NEGATIVE mg/dL
SPECIFIC GRAVITY, URINE: 1.008 (ref 1.005–1.030)
pH: 7 (ref 5.0–8.0)

## 2017-04-26 LAB — POCT PREGNANCY, URINE: PREG TEST UR: NEGATIVE

## 2017-04-26 LAB — CBC
HCT: 44 % (ref 35.0–47.0)
Hemoglobin: 14.7 g/dL (ref 12.0–16.0)
MCH: 29.8 pg (ref 26.0–34.0)
MCHC: 33.5 g/dL (ref 32.0–36.0)
MCV: 88.7 fL (ref 80.0–100.0)
PLATELETS: 399 10*3/uL (ref 150–440)
RBC: 4.95 MIL/uL (ref 3.80–5.20)
RDW: 13.5 % (ref 11.5–14.5)
WBC: 14.4 10*3/uL — AB (ref 3.6–11.0)

## 2017-04-26 MED ORDER — KETOROLAC TROMETHAMINE 30 MG/ML IJ SOLN
30.0000 mg | Freq: Once | INTRAMUSCULAR | Status: AC
Start: 2017-04-26 — End: 2017-04-26
  Administered 2017-04-26: 30 mg via INTRAVENOUS
  Filled 2017-04-26: qty 1

## 2017-04-26 MED ORDER — ONDANSETRON HCL 4 MG/2ML IJ SOLN
4.0000 mg | Freq: Once | INTRAMUSCULAR | Status: AC
  Administered 2017-04-26: 4 mg via INTRAVENOUS

## 2017-04-26 MED ORDER — TAMSULOSIN HCL 0.4 MG PO CAPS: 0.4000 mg | ORAL_CAPSULE | Freq: Every day | ORAL | 0 refills | Status: DC

## 2017-04-26 MED ORDER — OXYCODONE-ACETAMINOPHEN 5-325 MG PO TABS: 2.0000 | ORAL_TABLET | ORAL | 0 refills | Status: DC | PRN

## 2017-04-26 MED ORDER — ONDANSETRON HCL 4 MG/2ML IJ SOLN
INTRAMUSCULAR | Status: AC
  Administered 2017-04-26: 4 mg via INTRAVENOUS
  Filled 2017-04-26: qty 2

## 2017-04-26 MED ORDER — SODIUM CHLORIDE 0.9 % IV BOLUS (SEPSIS)
1000.0000 mL | Freq: Once | INTRAVENOUS | Status: AC
  Administered 2017-04-26: 1000 mL via INTRAVENOUS

## 2017-04-26 MED ORDER — MORPHINE SULFATE (PF) 4 MG/ML IV SOLN
INTRAVENOUS | Status: AC
  Administered 2017-04-26: 4 mg via INTRAVENOUS
  Filled 2017-04-26: qty 1

## 2017-04-26 MED ORDER — MORPHINE SULFATE (PF) 4 MG/ML IV SOLN
4.0000 mg | Freq: Once | INTRAVENOUS | Status: AC
  Administered 2017-04-26: 4 mg via INTRAVENOUS

## 2017-04-26 MED ORDER — ONDANSETRON 4 MG PO TBDP: 4.0000 mg | ORAL_TABLET | Freq: Three times a day (TID) | ORAL | 0 refills | Status: DC | PRN

## 2017-04-26 NOTE — ED Provider Notes (Signed)
[facility] Emergency Department Provider Note   ____________________________________________   First MD Initiated Contact with Patient 04/26/17 0406     (approximate)  I have reviewed the triage vital signs and the nursing notes.   HISTORY  Chief Complaint Flank Pain (left)    HPI Vickie Gilbert is a 37 y.o. female who comes into the hospital today with some left-sided pain.  The patient states that she woke up with some sharp pain on her left side and it spread around to her back and into her abdomen.  She states it was aching and she had some pressure.  She felt like she needed to have a bowel movement.  She also had some lower abdominal cramping and it just kept getting worse throughout the evening.  The patient had a lumpectomy about 1 week ago when she was not sure if it was may be a bowel blockage.  The patient states though that she has been having bowel movements.  The patient reports that she used a heating pad but she could not make the pain better.  She did not take any oral medications.  The patient decided to come into the hospital for further evaluation.  She had some nausea with no vomiting and she states her pain currently is a 2-3 out of 10 in intensity after pain medicine.  Past Medical History:  Diagnosis Date  . Anxiety   . Depression   . Thyroid disease    hypothyroidism     There are no active problems to display for this patient.   Past Surgical History:  Procedure Laterality Date  . BREAST BIOPSY Left 02/02/2017   distortion. coil clip. path pending  . CHOLECYSTECTOMY    . GALLBLADDER SURGERY      Prior to Admission medications   Medication Sig Start Date End Date Taking? Authorizing Provider  clobetasol ointment (TEMOVATE) 0.05 % Apply 1 application topically 2 (two) times daily. Apply to affected area 12/23/16   Shambley, Melody N, CNM  FLUoxetine (PROZAC) 10 MG capsule Take 1 capsule (10 mg total) by mouth daily.  12/23/16   Shambley, Melody N, CNM  ondansetron (ZOFRAN ODT) 4 MG disintegrating tablet Take 1 tablet (4 mg total) by mouth every 8 (eight) hours as needed for nausea or vomiting. 04/26/17   Rebecka Apley, MD  oxyCODONE-acetaminophen (PERCOCET/ROXICET) 5-325 MG tablet Take 2 tablets by mouth every 4 (four) hours as needed for severe pain. 04/26/17   Rebecka Apley, MD  tamsulosin (FLOMAX) 0.4 MG CAPS capsule Take 1 capsule (0.4 mg total) by mouth daily. 04/26/17   Rebecka Apley, MD  triazolam (HALCION) 0.25 MG tablet Take 1 tablet (0.25 mg total) by mouth at bedtime as needed for sleep. Patient not taking: Reported on 12/23/2016 11/28/15   Purcell Nails, CNM  venlafaxine XR (EFFEXOR XR) 37.5 MG 24 hr capsule Take 1 capsule (37.5 mg total) by mouth daily with breakfast. Patient not taking: Reported on 12/23/2016 11/28/15   Purcell Nails, CNM    Allergies Patient has no known allergies.  Family History  Problem Relation Age of Onset  . Thyroid disease Mother   . Thyroid disease Sister   . Thyroid disease Maternal Aunt   . Thyroid disease Maternal Grandmother     Social History Social History   Tobacco Use  . Smoking status: Never Smoker  . Smokeless tobacco: Never Used  Substance Use Topics  . Alcohol use: Yes  . Drug use:  No    Review of Systems  Constitutional: No fever/chills Eyes: No visual changes. ENT: No sore throat. Cardiovascular: Denies chest pain. Respiratory: Denies shortness of breath. Gastrointestinal: Nausea and abdominal pain.  No nausea, no vomiting.  No diarrhea.  No constipation. Genitourinary: Negative for dysuria. Musculoskeletal: Left flank pain Skin: Negative for rash. Neurological: Negative for headaches, focal weakness or numbness.   ____________________________________________   PHYSICAL EXAM:  VITAL SIGNS: ED Triage Vitals  Enc Vitals Group     BP 04/26/17 0334 (!) 210/143     Pulse Rate 04/26/17 0334 75     Resp 04/26/17  0334 (!) 24     Temp 04/26/17 0334 98.1 F (36.7 C)     Temp Source 04/26/17 0334 Oral     SpO2 04/26/17 0334 100 %     Weight 04/26/17 0332 190 lb (86.2 kg)     Height --      Head Circumference --      Peak Flow --      Pain Score 04/26/17 0331 8     Pain Loc --      Pain Edu? --      Excl. in GC? --     Constitutional: Alert and oriented. Well appearing and in moderate distress. Eyes: Conjunctivae are normal. PERRL. EOMI. Head: Atraumatic. Nose: No congestion/rhinnorhea. Mouth/Throat: Mucous membranes are moist.  Oropharynx non-erythematous. Cardiovascular: Normal rate, regular rhythm. Grossly normal heart sounds.  Good peripheral circulation. Respiratory: Normal respiratory effort.  No retractions. Lungs CTAB. Gastrointestinal: Soft and nontender. No distention.  Positive bowel sounds, mild left-sided CVA tenderness to palpation Musculoskeletal: No lower extremity tenderness nor edema.  Neurologic:  Normal speech and language.  Skin:  Skin is warm, dry and intact.  Psychiatric: Mood and affect are normal.   ____________________________________________   LABS (all labs ordered are listed, but only abnormal results are displayed)  Labs Reviewed  URINALYSIS, COMPLETE (UACMP) WITH MICROSCOPIC - Abnormal; Notable for the following components:      Result Value   Color, Urine YELLOW (*)    APPearance CLEAR (*)    Hgb urine dipstick LARGE (*)    Squamous Epithelial / LPF 0-5 (*)    All other components within normal limits  CBC - Abnormal; Notable for the following components:   WBC 14.4 (*)    All other components within normal limits  COMPREHENSIVE METABOLIC PANEL - Abnormal; Notable for the following components:   Potassium 3.3 (*)    CO2 21 (*)    Glucose, Bld 125 (*)    All other components within normal limits  POC URINE PREG, ED  POCT PREGNANCY, URINE    ____________________________________________  EKG  none ____________________________________________  RADIOLOGY  ED MD interpretation:  CT renal stone study: Mild left hydroureteronephrosis with a probable punctate obstructing stone at the left UVJ.  Official radiology report(s): Ct Renal Stone Study  Result Date: 04/26/2017 CLINICAL DATA:  Left flank pain. EXAM: CT ABDOMEN AND PELVIS WITHOUT CONTRAST TECHNIQUE: Multidetector CT imaging of the abdomen and pelvis was performed following the standard protocol without IV contrast. COMPARISON:  None. FINDINGS: Lower chest: Lung bases are clear. Hepatobiliary: No focal liver abnormality is seen. Status post cholecystectomy. No biliary dilatation. Pancreas: No ductal dilatation or inflammation. Spleen: Normal in size without focal abnormality. Adrenals/Urinary Tract: No adrenal nodule. Mild left hydroureteronephrosis. Probable punctate obstructing stone at the left ureterovesicular junction, best appreciated on coronal reformat image 84. No right hydronephrosis or hydroureter. No additional urolithiasis. Urinary bladder  is physiologically distended without wall thickening. Stomach/Bowel: Stomach is within normal limits. Appendix appears normal. No evidence of bowel wall thickening, distention, or inflammatory changes. Vascular/Lymphatic: Mild mesenteric haziness with small mesenteric lymph nodes in the left upper abdomen. The abdominal aorta is normal caliber. Reproductive: Uterus and bilateral adnexa are unremarkable. Other: No free air, free fluid, or intra-abdominal fluid collection. Musculoskeletal: There are no acute or suspicious osseous abnormalities. IMPRESSION: 1. Mild left hydroureteronephrosis with probable punctate obstructing stone at the left ureterovesicular junction. 2. Mild mesenteric haziness with small mesenteric nodes in the left upper abdomen, may be reactive or mesenteric panniculitis. Electronically Signed   By: Rubye OaksMelanie  Ehinger  M.D.   On: 04/26/2017 05:51    ____________________________________________   PROCEDURES  Procedure(s) performed: None  Procedures  Critical Care performed: No  ____________________________________________   INITIAL IMPRESSION / ASSESSMENT AND PLAN / ED COURSE  As part of my medical decision making, I reviewed the following data within the electronic MEDICAL RECORD NUMBER Notes from prior ED visits and [place] Controlled Substance Database   This is a 37 year old female who comes into the hospital today with some left-sided flank and abdomen pain.  My differential diagnosis includes kidney stone, bowel obstruction, diverticulitis, urinary tract infection.  We did check a CBC and a CMP on the patient.  The patient's white blood cell count is 14.4.  The patient received a dose of morphine and Zofran and her pain improved.  The patient will receive a liter of normal saline and I will send her for a CT scan checking for kidney stones.    The patient's urinalysis is negative and her blood work is unremarkable.  It appears that the patient may have a punctate stone with some hydroureteronephrosis.  I did give the patient a dose of Toradol and her pain is improved.  She will be discharged home to follow-up with urology.  ____________________________________________   FINAL CLINICAL IMPRESSION(S) / ED DIAGNOSES  Final diagnoses:  Kidney stone  Left flank pain     ED Discharge Orders        Ordered    tamsulosin (FLOMAX) 0.4 MG CAPS capsule  Daily     04/26/17 0722    oxyCODONE-acetaminophen (PERCOCET/ROXICET) 5-325 MG tablet  Every 4 hours PRN     04/26/17 0722    ondansetron (ZOFRAN ODT) 4 MG disintegrating tablet  Every 8 hours PRN     04/26/17 [id number]       Note:  This document was prepared using Dragon voice recognition software and may include unintentional dictation errors.    Rebecka ApleyWebster, Allison P, MD 04/26/17 [phone]

## 2017-04-26 NOTE — ED Notes (Signed)
Patient transported to CT 

## 2017-04-26 NOTE — Discharge Instructions (Signed)
Please follow-up with urology should your pain persist.  Please return with any fevers, vomiting or worsening pain.

## 2017-04-26 NOTE — ED Notes (Signed)
Pt notified urine sample is needed, pt verbalized understanding of this.

## 2017-04-26 NOTE — ED Triage Notes (Signed)
Patient c/o left flank pain radiating to left lower abdomen. Patient denies urinary changes. Patient reports 2 BMs yesterday and 1 small BM today. Patient reports multiple emeses today. Patient reports lumpectomy of left breast on 2/22.

## 2017-04-28 ENCOUNTER — Encounter: Payer: Self-pay | Admitting: Urology

## 2017-04-28 ENCOUNTER — Ambulatory Visit (INDEPENDENT_AMBULATORY_CARE_PROVIDER_SITE_OTHER): Payer: 59 | Admitting: Urology

## 2017-04-28 VITALS — BP 129/83 | HR 91 | Ht 67.0 in | Wt 200.7 lb

## 2017-04-28 DIAGNOSIS — N200 Calculus of kidney: Secondary | ICD-10-CM | POA: Diagnosis not present

## 2017-04-28 NOTE — Progress Notes (Signed)
04/28/2017 3:54 PM   Vickie Gilbert Nov 12, 1980 [id number]  Referring provider: Danella Penton, MD [phone] [facility] [address] [name] [place], [place] [address]  Chief Complaint  Patient presents with  . Nephrolithiasis    HPI: The patient is a 37 year old female who presents today for ER follow-up after a CT showed  a punctate left UVJ stone with mild hydroureteronephrosis.  She originally presented to the hospital with left flank pain.  She has no other stones visible on her CT. she has since passed the stone and brought in for analysis.  Her symptoms including significant left flank pain have resolved.  She has never had a stone prior to this.  She has no other  genitourinary complaints at this time.   PMH: Past Medical History:  Diagnosis Date  . Anxiety   . Depression   . Thyroid disease    hypothyroidism     Surgical History: Past Surgical History:  Procedure Laterality Date  . BREAST BIOPSY Left 02/02/2017   distortion. coil clip. path pending  . CHOLECYSTECTOMY    . GALLBLADDER SURGERY      Home Medications:  Allergies as of 04/28/2017   No Known Allergies     Medication List        Accurate as of 04/28/17  3:54 PM. Always use your most recent med list.          clobetasol ointment 0.05 % Commonly known as:  TEMOVATE Apply 1 application topically 2 (two) times daily. Apply to affected area   diazepam 2 MG tablet Commonly known as:  VALIUM diazepam 2 mg tablet   FLUoxetine 10 MG capsule Commonly known as:  PROZAC Take 1 capsule (10 mg total) by mouth daily.   levothyroxine 100 MCG tablet Commonly known as:  SYNTHROID, LEVOTHROID levothyroxine 100 mcg tablet   oxyCODONE-acetaminophen 5-325 MG tablet Commonly known as:  PERCOCET/ROXICET Take 2 tablets by mouth every 4 (four) hours as needed for severe pain.   tamsulosin 0.4 MG Caps capsule Commonly known as:  FLOMAX Take 1 capsule (0.4 mg total) by mouth daily.        Allergies: No Known Allergies  Family History: Family History  Problem Relation Age of Onset  . Thyroid disease Mother   . Thyroid disease Sister   . Thyroid disease Maternal Aunt   . Thyroid disease Maternal Grandmother   . Bladder Cancer Neg Hx   . Kidney cancer Neg Hx     Social History:  reports that  has never smoked. she has never used smokeless tobacco. She reports that she drinks alcohol. She reports that she does not use drugs.  ROS: UROLOGY Frequent Urination?: No Hard to postpone urination?: No Burning/pain with urination?: Yes Get up at night to urinate?: No Leakage of urine?: No Urine stream starts and stops?: No Trouble starting stream?: No Do you have to strain to urinate?: No Blood in urine?: No Urinary tract infection?: No Sexually transmitted disease?: No Injury to kidneys or bladder?: No Painful intercourse?: No Weak stream?: No Currently pregnant?: No Vaginal bleeding?: No Last menstrual period?: n  Gastrointestinal Nausea?: Yes Vomiting?: Yes Indigestion/heartburn?: No Diarrhea?: No Constipation?: No  Constitutional Fever: No Night sweats?: Yes Weight loss?: No Fatigue?: Yes  Skin Skin rash/lesions?: Yes Itching?: Yes  Eyes Blurred vision?: No Double vision?: No  Ears/Nose/Throat Sore throat?: No Sinus problems?: No  Hematologic/Lymphatic Swollen glands?: No Easy bruising?: No  Cardiovascular Leg swelling?: No Chest pain?: No  Respiratory Cough?:  No Shortness of breath?: No  Endocrine Excessive thirst?: No  Musculoskeletal Back pain?: Yes Joint pain?: No  Neurological Headaches?: Yes Dizziness?: Yes  Psychologic Depression?: No Anxiety?: No  Physical Exam: BP 129/83 (BP Location: Right Arm, Patient Position: Sitting, Cuff Size: Normal)   Pulse 91   Ht 5\' 7"  (1.702 m)   Wt 200 lb 11.2 oz (91 kg)   LMP 04/26/2017 Comment: neg preg test  BMI 31.43 kg/m   Constitutional:  Alert and oriented, No acute  distress. HEENT: [place] AT, moist mucus membranes.  Trachea midline, no masses. Cardiovascular: No clubbing, cyanosis, or edema. Respiratory: Normal respiratory effort, no increased work of breathing. GI: Abdomen is soft, nontender, nondistended, no abdominal masses GU: No CVA tenderness.  Skin: No rashes, bruises or suspicious lesions. Lymph: No cervical or inguinal adenopathy. Neurologic: Grossly intact, no focal deficits, moving all 4 extremities. Psychiatric: Normal mood and affect.  Laboratory Data: Lab Results  Component Value Date   WBC 14.4 (H) 04/26/2017   HGB 14.7 04/26/2017   HCT 44.0 04/26/2017   MCV 88.7 04/26/2017   PLT 399 04/26/2017    Lab Results  Component Value Date   CREATININE 0.90 04/26/2017    No results found for: PSA  No results found for: TESTOSTERONE  No results found for: HGBA1C  Urinalysis    Component Value Date/Time   COLORURINE YELLOW (A) 04/26/2017 0343   APPEARANCEUR CLEAR (A) 04/26/2017 0343   APPEARANCEUR Hazy 01/15/2012 2246   LABSPEC 1.008 04/26/2017 0343   LABSPEC 1.014 01/15/2012 2246   PHURINE 7.0 04/26/2017 0343   GLUCOSEU NEGATIVE 04/26/2017 0343   GLUCOSEU Negative 01/15/2012 2246   HGBUR LARGE (A) 04/26/2017 0343   BILIRUBINUR NEGATIVE 04/26/2017 0343   BILIRUBINUR Negative 01/15/2012 2246   KETONESUR NEGATIVE 04/26/2017 0343   PROTEINUR NEGATIVE 04/26/2017 0343   NITRITE NEGATIVE 04/26/2017 0343   LEUKOCYTESUR NEGATIVE 04/26/2017 0343   LEUKOCYTESUR Negative 01/15/2012 2246    Pertinent Imaging: CT images reviewed as above with solitary punctate left UVJ stone.  Assessment & Plan:   1.  Left punctate UVJ stone Patient has passed her stone.  We will send for stone analysis.  We discussed ways to prevent future stone formation.  We will call her with results of stone analysis.  She can follow-up with us as needed.  Return if symptoms worsen or fail to improve.  [name], MD  [facility] [address], [address] [place], [place] [address] [phone]

## 2017-04-29 LAB — URINALYSIS, COMPLETE
Bilirubin, UA: NEGATIVE
Glucose, UA: NEGATIVE
Ketones, UA: NEGATIVE
Leukocytes, UA: NEGATIVE
Nitrite, UA: NEGATIVE
PH UA: 7 (ref 5.0–7.5)
PROTEIN UA: NEGATIVE
RBC, UA: NEGATIVE
Specific Gravity, UA: 1.015 (ref 1.005–1.030)
UUROB: 0.2 mg/dL (ref 0.2–1.0)

## 2017-04-29 LAB — MICROSCOPIC EXAMINATION
BACTERIA UA: NONE SEEN
RBC, UA: NONE SEEN /hpf (ref 0–?)

## 2017-05-10 DIAGNOSIS — N200 Calculus of kidney: Secondary | ICD-10-CM | POA: Diagnosis not present

## 2017-05-18 ENCOUNTER — Other Ambulatory Visit: Payer: Self-pay | Admitting: Urology

## 2017-05-20 DIAGNOSIS — E039 Hypothyroidism, unspecified: Secondary | ICD-10-CM | POA: Diagnosis not present

## 2017-05-24 DIAGNOSIS — N6489 Other specified disorders of breast: Secondary | ICD-10-CM | POA: Diagnosis not present

## 2017-06-07 DIAGNOSIS — N6489 Other specified disorders of breast: Secondary | ICD-10-CM | POA: Diagnosis not present

## 2017-06-28 DIAGNOSIS — N6489 Other specified disorders of breast: Secondary | ICD-10-CM | POA: Diagnosis not present

## 2017-12-27 ENCOUNTER — Ambulatory Visit (INDEPENDENT_AMBULATORY_CARE_PROVIDER_SITE_OTHER): Payer: 59 | Admitting: Obstetrics and Gynecology

## 2017-12-27 ENCOUNTER — Encounter: Payer: Self-pay | Admitting: Obstetrics and Gynecology

## 2017-12-27 VITALS — BP 107/78 | HR 67 | Ht 67.0 in | Wt 213.2 lb

## 2017-12-27 DIAGNOSIS — Z01419 Encounter for gynecological examination (general) (routine) without abnormal findings: Secondary | ICD-10-CM

## 2017-12-27 NOTE — Patient Instructions (Signed)
Preventive Care 18-39 Years, Female Preventive care refers to lifestyle choices and visits with your health care provider that can promote health and wellness. What does preventive care include?  A yearly physical exam. This is also called an annual well check.  Dental exams once or twice a year.  Routine eye exams. Ask your health care provider how often you should have your eyes checked.  Personal lifestyle choices, including: ? Daily care of your teeth and gums. ? Regular physical activity. ? Eating a healthy diet. ? Avoiding tobacco and drug use. ? Limiting alcohol use. ? Practicing safe sex. ? Taking vitamin and mineral supplements as recommended by your health care provider. What happens during an annual well check? The services and screenings done by your health care provider during your annual well check will depend on your age, overall health, lifestyle risk factors, and family history of disease. Counseling Your health care provider may ask you questions about your:  Alcohol use.  Tobacco use.  Drug use.  Emotional well-being.  Home and relationship well-being.  Sexual activity.  Eating habits.  Work and work Statistician.  Method of birth control.  Menstrual cycle.  Pregnancy history.  Screening You may have the following tests or measurements:  Height, weight, and BMI.  Diabetes screening. This is done by checking your blood sugar (glucose) after you have not eaten for a while (fasting).  Blood pressure.  Lipid and cholesterol levels. These may be checked every 5 years starting at age 29.  Skin check.  Hepatitis C blood test.  Hepatitis B blood test.  Sexually transmitted disease (STD) testing.  BRCA-related cancer screening. This may be done if you have a family history of breast, ovarian, tubal, or peritoneal cancers.  Pelvic exam and Pap test. This may be done every 3 years starting at age 70. Starting at age 68, this may be done  every 5 years if you have a Pap test in combination with an HPV test.  Discuss your test results, treatment options, and if necessary, the need for more tests with your health care provider. Vaccines Your health care provider may recommend certain vaccines, such as:  Influenza vaccine. This is recommended every year.  Tetanus, diphtheria, and acellular pertussis (Tdap, Td) vaccine. You may need a Td booster every 10 years.  Varicella vaccine. You may need this if you have not been vaccinated.  HPV vaccine. If you are 101 or younger, you may need three doses over 6 months.  Measles, mumps, and rubella (MMR) vaccine. You may need at least one dose of MMR. You may also need a second dose.  Pneumococcal 13-valent conjugate (PCV13) vaccine. You may need this if you have certain conditions and were not previously vaccinated.  Pneumococcal polysaccharide (PPSV23) vaccine. You may need one or two doses if you smoke cigarettes or if you have certain conditions.  Meningococcal vaccine. One dose is recommended if you are age 13-21 years and a first-year college student living in a residence hall, or if you have one of several medical conditions. You may also need additional booster doses.  Hepatitis A vaccine. You may need this if you have certain conditions or if you travel or work in places where you may be exposed to hepatitis A.  Hepatitis B vaccine. You may need this if you have certain conditions or if you travel or work in places where you may be exposed to hepatitis B.  Haemophilus influenzae type b (Hib) vaccine. You may need this  if you have certain risk factors.  Talk to your health care provider about which screenings and vaccines you need and how often you need them. This information is not intended to replace advice given to you by your health care provider. Make sure you discuss any questions you have with your health care provider. Document Released: 04/06/2001 Document Revised:  10/29/2015 Document Reviewed: 12/10/2014 Elsevier Interactive Patient Education  [employer].

## 2017-12-27 NOTE — Progress Notes (Signed)
Subjective:   Vickie Gilbert is a 37 y.o. G53P0 Caucasian female here for a routine well-woman exam.  Patient's last menstrual period was 12/04/2017.    Current complaints: none PCP: Hyacinth Meeker       doesn't desire labs  Social History: Sexual: heterosexual Marital Status: married Living situation: with family Occupation: unknown occupation Tobacco/alcohol: no tobacco use Illicit drugs: no history of illicit drug use  The following portions of the patient's history were reviewed and updated as appropriate: allergies, current medications, past family history, past medical history, past social history, past surgical history and problem list.  Past Medical History Past Medical History:  Diagnosis Date  . Anxiety   . Depression   . Thyroid disease    hypothyroidism     Past Surgical History Past Surgical History:  Procedure Laterality Date  . BREAST BIOPSY Left 02/02/2017   distortion. coil clip. path pending  . CHOLECYSTECTOMY    . GALLBLADDER SURGERY      Gynecologic History G3P0  Patient's last menstrual period was 12/04/2017. Last Pap: 2018. Results were: normal Last mammogram: 2018. Results were: abnormal   Obstetric History OB History  Gravida Para Term Preterm AB Living  3            SAB TAB Ectopic Multiple Live Births               # Outcome Date GA Lbr Len/2nd Weight Sex Delivery Anes PTL Lv  3 Gravida 2012    M Vag-Spont     2 Gravida 2006    F Vag-Spont     1 Gravida 2004    F Vag-Spont       Current Medications Current Outpatient Medications on File Prior to Visit  Medication Sig Dispense Refill  . levothyroxine (SYNTHROID, LEVOTHROID) 100 MCG tablet levothyroxine 100 mcg tablet    . clobetasol ointment (TEMOVATE) 0.05 % Apply 1 application topically 2 (two) times daily. Apply to affected area (Patient not taking: Reported on 12/27/2017) 30 g 2   No current facility-administered medications on file prior to visit.     Review of Systems Patient  denies any headaches, blurred vision, shortness of breath, chest pain, abdominal pain, problems with bowel movements, urination, or intercourse.  Objective:  BP 107/78   Pulse 67   Ht 5\' 7"  (1.702 m)   Wt 213 lb 3.2 oz (96.7 kg)   LMP 12/04/2017   BMI 33.39 kg/m  Physical Exam  General:  Well developed, well nourished, no acute distress. She is alert and oriented x3. Skin:  Warm and dry Neck:  Midline trachea, no thyromegaly or nodules Cardiovascular: Regular rate and rhythm, no murmur heard Lungs:  Effort normal, all lung fields clear to auscultation bilaterally Breasts:  No dominant palpable mass, retraction, or nipple discharge Abdomen:  Soft, non tender, no hepatosplenomegaly or masses Pelvic:  External genitalia is normal in appearance.  The vagina is normal in appearance. The cervix is bulbous, no CMT.  Thin prep pap is not done . Uterus is felt to be normal size, shape, and contour.  No adnexal masses or tenderness noted. Extremities:  No swelling or varicosities noted Psych:  She has a normal mood and affect  Assessment:   Healthy well-woman exam S/p breast hyperplasia Hypothyroidism anxiety  Plan:   F/U 1 year for AE, or sooner if needed Mammogram ordered  Brucha Ahlquist Suzan Nailer, CNM

## 2017-12-28 LAB — COMPREHENSIVE METABOLIC PANEL
A/G RATIO: 1.4 (ref 1.2–2.2)
ALK PHOS: 103 IU/L (ref 39–117)
ALT: 13 IU/L (ref 0–32)
AST: 13 IU/L (ref 0–40)
Albumin: 4.3 g/dL (ref 3.5–5.5)
BILIRUBIN TOTAL: 0.4 mg/dL (ref 0.0–1.2)
BUN/Creatinine Ratio: 11 (ref 9–23)
BUN: 8 mg/dL (ref 6–20)
CALCIUM: 9.3 mg/dL (ref 8.7–10.2)
CO2: 19 mmol/L — ABNORMAL LOW (ref 20–29)
Chloride: 104 mmol/L (ref 96–106)
Creatinine, Ser: 0.73 mg/dL (ref 0.57–1.00)
GFR calc Af Amer: 121 mL/min/{1.73_m2} (ref >59)
GFR, EST NON AFRICAN AMERICAN: 105 mL/min/{1.73_m2} (ref >59)
GLOBULIN, TOTAL: 3 g/dL (ref 1.5–4.5)
Glucose: 86 mg/dL (ref 65–99)
POTASSIUM: 4.2 mmol/L (ref 3.5–5.2)
SODIUM: 139 mmol/L (ref 134–144)
Total Protein: 7.3 g/dL (ref 6.0–8.5)

## 2017-12-28 LAB — LIPID PANEL
CHOLESTEROL TOTAL: 205 mg/dL — AB (ref 100–199)
Chol/HDL Ratio: 4.2 ratio (ref 0.0–4.4)
HDL: 49 mg/dL (ref >39)
LDL CALC: 122 mg/dL — AB (ref 0–99)
TRIGLYCERIDES: 169 mg/dL — AB (ref 0–149)
VLDL CHOLESTEROL CAL: 34 mg/dL (ref 5–40)

## 2017-12-28 LAB — THYROID PANEL WITH TSH
Free Thyroxine Index: 1.8 (ref 1.2–4.9)
T3 Uptake Ratio: 21 % — ABNORMAL LOW (ref 24–39)
T4, Total: 8.6 ug/dL (ref 4.5–12.0)
TSH: 1.32 u[IU]/mL (ref 0.450–4.500)

## 2017-12-28 LAB — HEMOGLOBIN A1C
ESTIMATED AVERAGE GLUCOSE: 105 mg/dL
Hgb A1c MFr Bld: 5.3 % (ref 4.8–5.6)

## 2017-12-29 ENCOUNTER — Telehealth: Payer: Self-pay | Admitting: *Deleted

## 2017-12-29 NOTE — Telephone Encounter (Signed)
Mailed all labs to pt 

## 2017-12-29 NOTE — Telephone Encounter (Signed)
-----   Message from [place], [place] sent at 12/28/2017  8:55 AM EST ----- Labs look fine except cholesterol is a little worse, should come down with exercise and weight loss

## 2018-01-04 ENCOUNTER — Telehealth: Payer: Self-pay | Admitting: Obstetrics and Gynecology

## 2018-01-04 NOTE — Telephone Encounter (Signed)
The patient called and stated that she would like to speak with her nurse in regards to her wanting to know the results of her last fibroid test and also wanting to alter her medications. No other information was disclosed. Please advise.

## 2018-12-29 ENCOUNTER — Encounter: Payer: 59 | Admitting: Obstetrics and Gynecology

## 2019-02-02 ENCOUNTER — Encounter: Payer: 59 | Admitting: Obstetrics and Gynecology

## 2019-02-08 ENCOUNTER — Encounter: Payer: 59 | Admitting: Obstetrics and Gynecology

## 2019-11-30 IMAGING — MG MM BREAST LOCALIZATION CLIP
6 series · 6 of 14 positions shown · non-contrast
Comparison: Previous exam(s).

CLINICAL DATA: 36-year-old female status post stereotactic biopsy
of left breast distortion.

EXAM:
DIAGNOSTIC LEFT MAMMOGRAM POST STEREOTACTIC BIOPSY

[L CC]
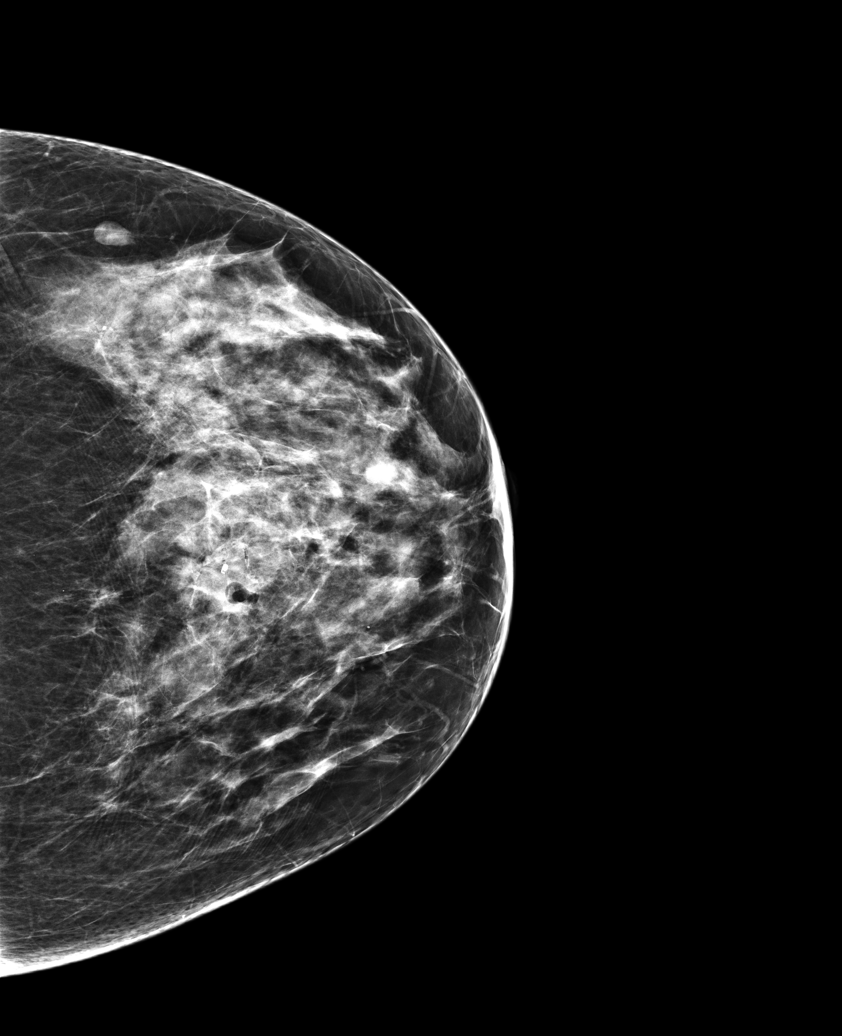

[L CC synth-2D]
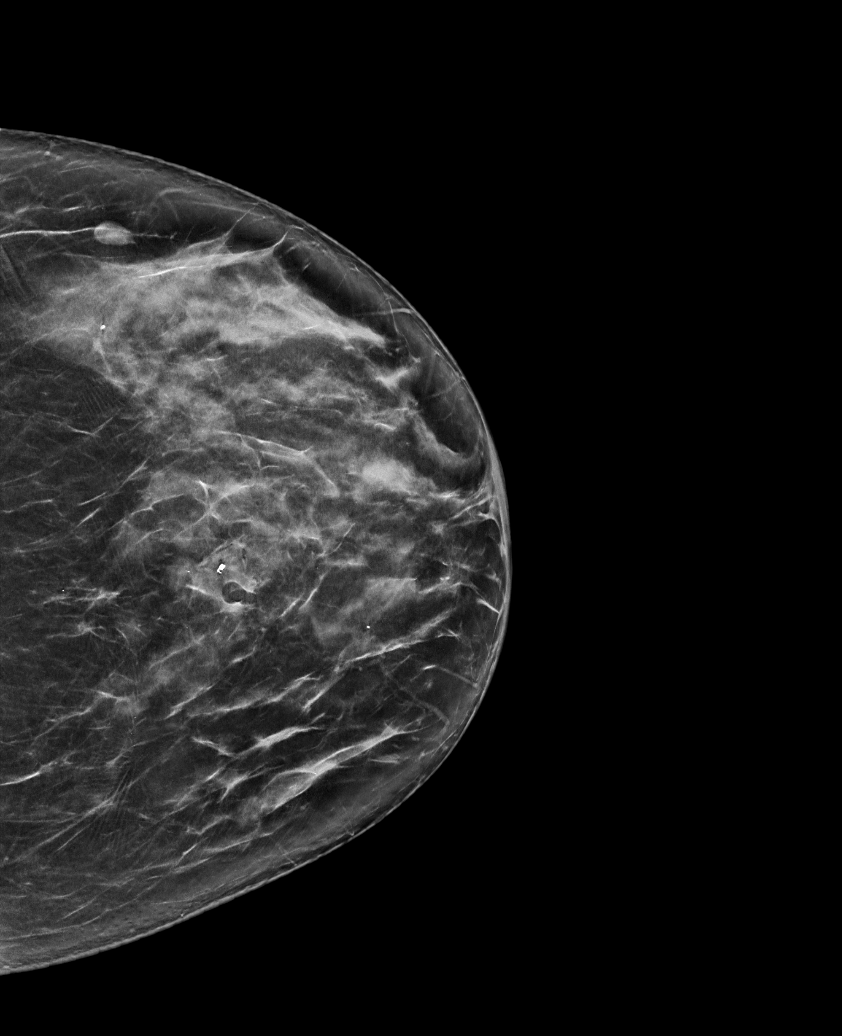

[L ML synth-2D]
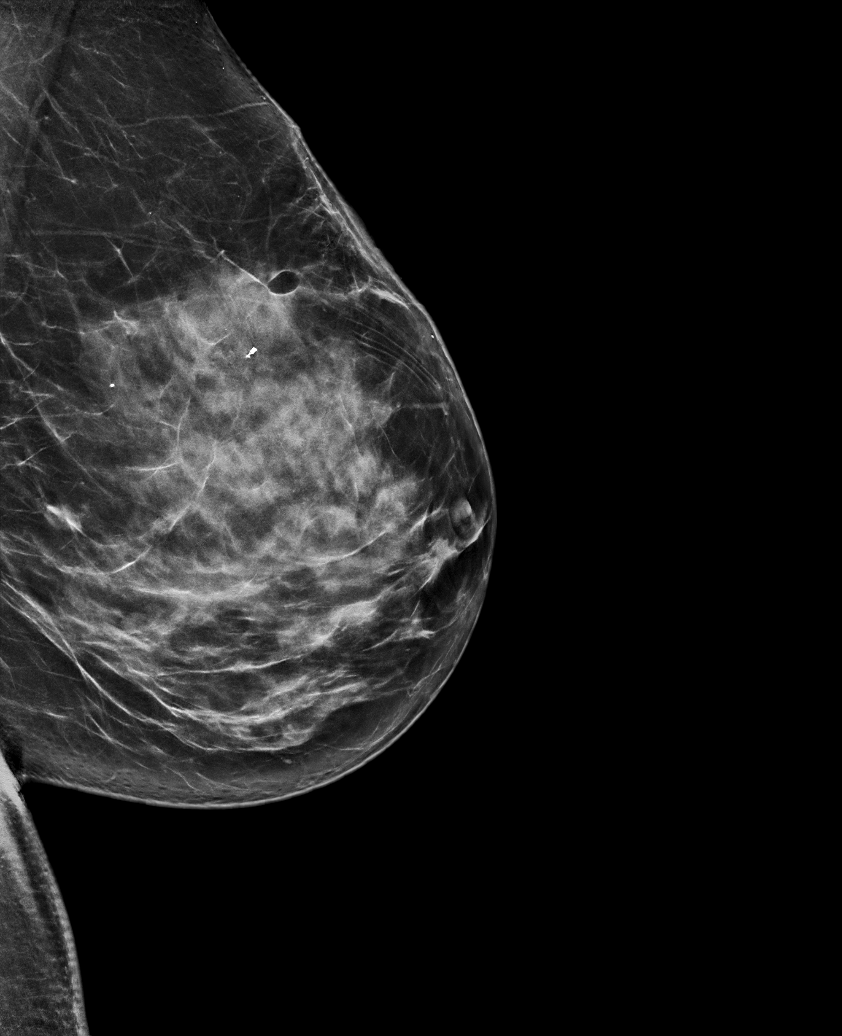

[L ML]
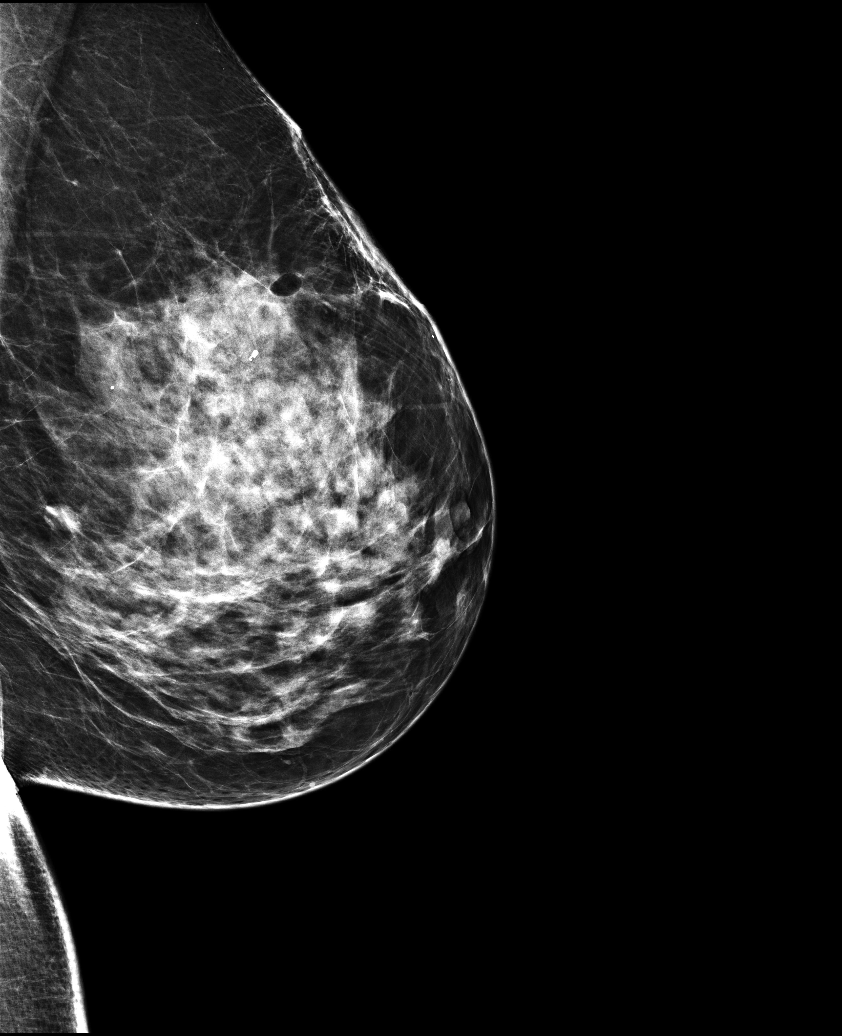

[L ML tomo · tomo slice 43/84.0]
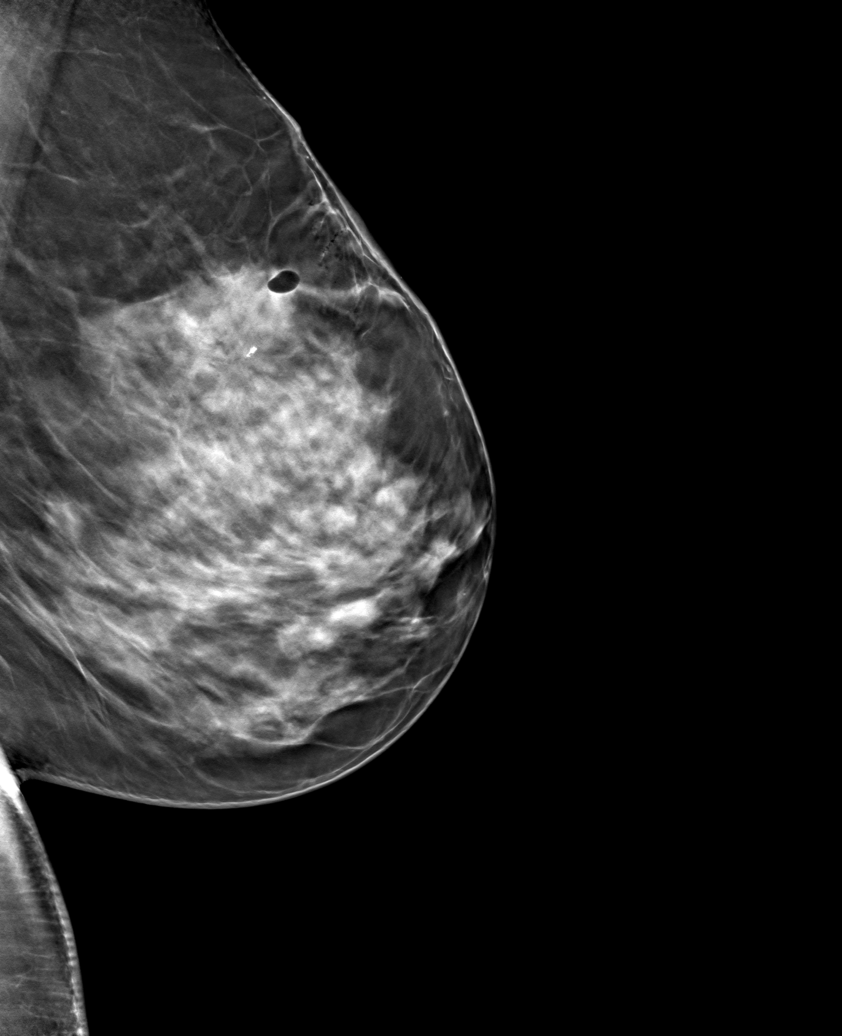

[L CC tomo · tomo slice 39/77.0]
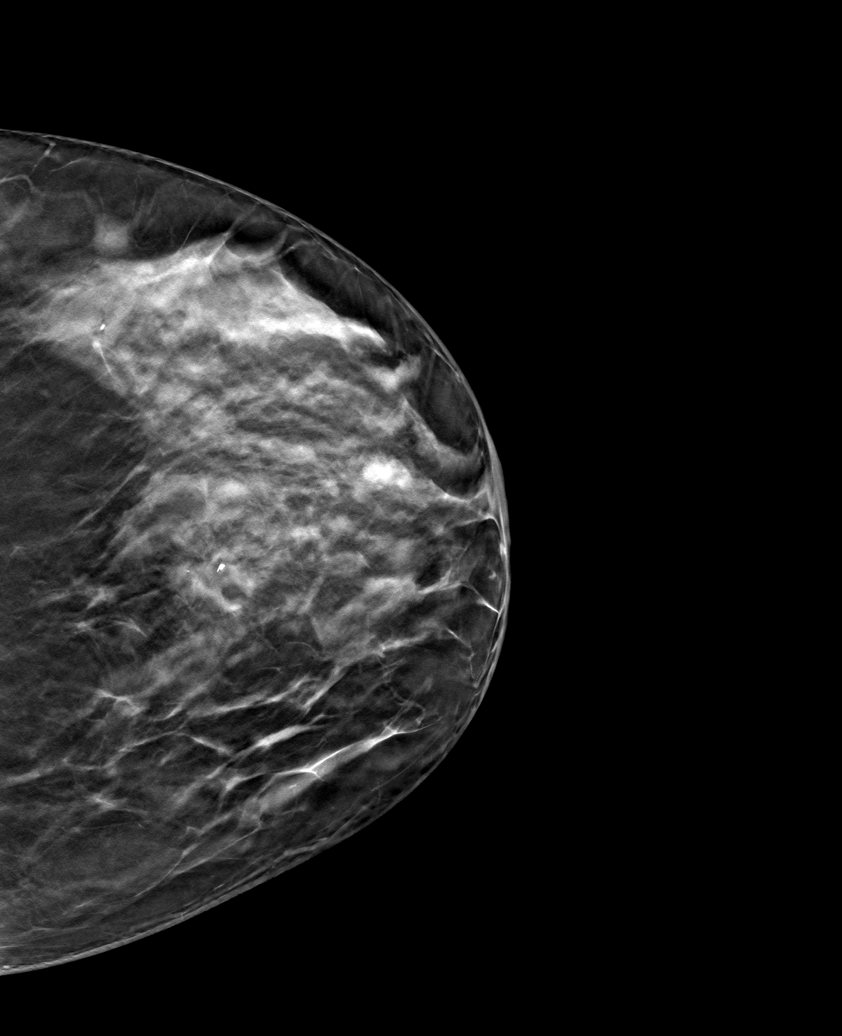

[6 of 14 positions shown; findings below may reference images not displayed]

FINDINGS: Mammographic images were obtained following stereotactic guided
biopsy of left breast distortion. A coil shaped clip is identified
in the superior slightly medial left breast at middle depth. This is
in the expected location status post stereotactic biopsy.
IMPRESSION: Coil shaped clip in the expected location status post stereotactic
biopsy of left breast distortion.

Final Assessment: Post Procedure Mammograms for Marker Placement

## 2020-02-21 IMAGING — CT CT RENAL STONE PROTOCOL
3 of 4 series · 10 of 46 positions shown, 15 images · non-contrast
Comparison: None.

CLINICAL DATA: Left flank pain.

EXAM:
CT ABDOMEN AND PELVIS WITHOUT CONTRAST
TECHNIQUE: Multidetector CT imaging of the abdomen and pelvis was performed
following the standard protocol without IV contrast.

[Series 4: lung bases · axial · 0.72mm/px · z∈[-794,-684]mm · 6 of 32 slices shown, 11 images]
[im 5/32  soft-tissue]
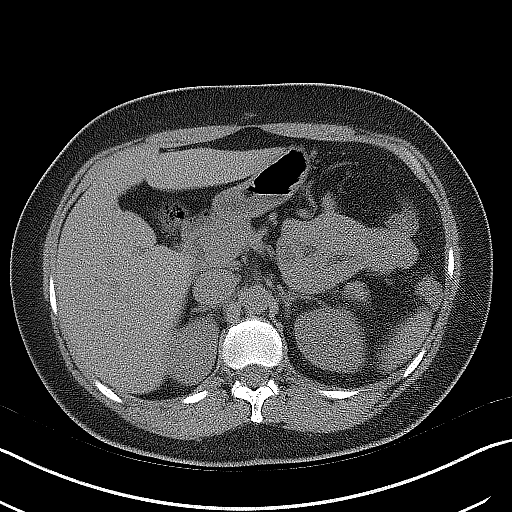
[im 5/32  bone]
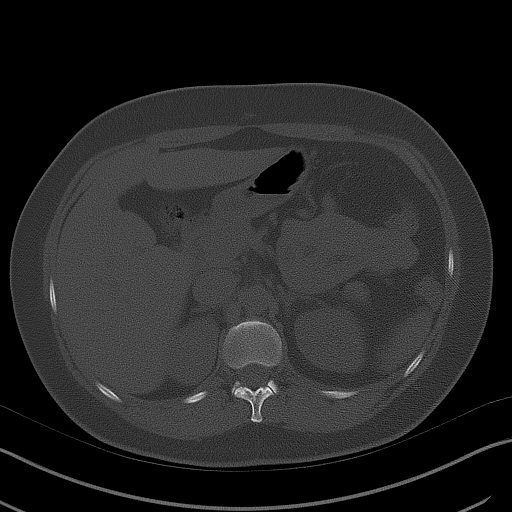
[im 9/32  soft-tissue]
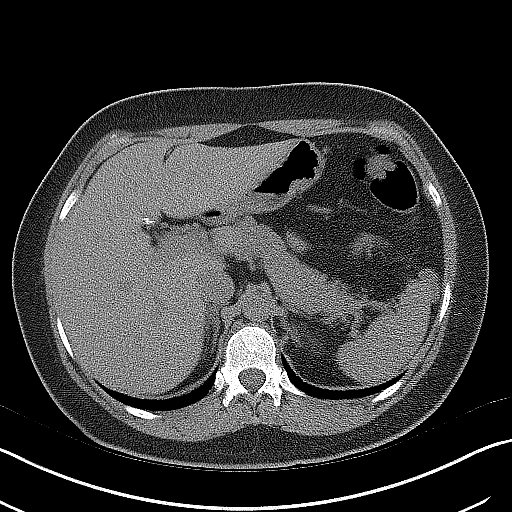
[im 14/32  soft-tissue]
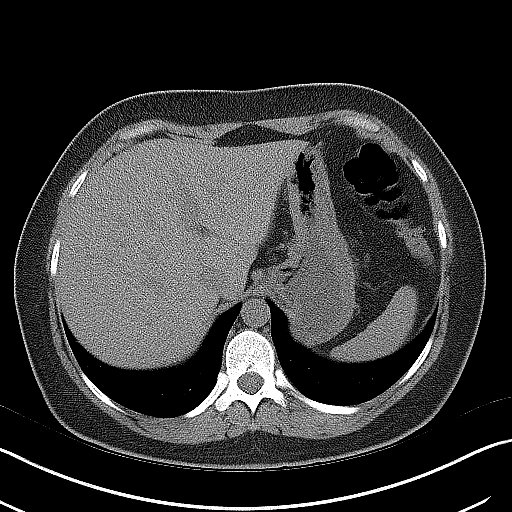
[im 14/32  lung]
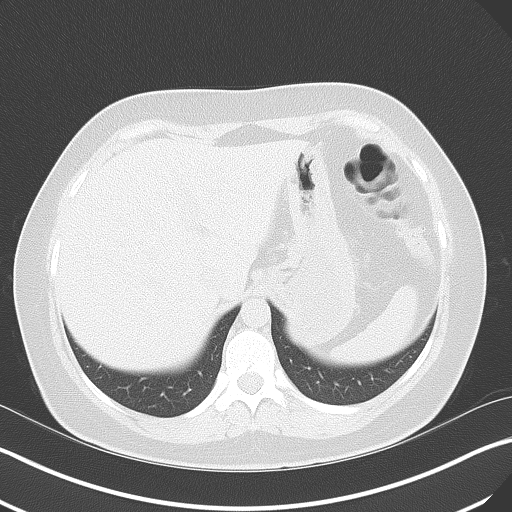
[im 18/32  soft-tissue]
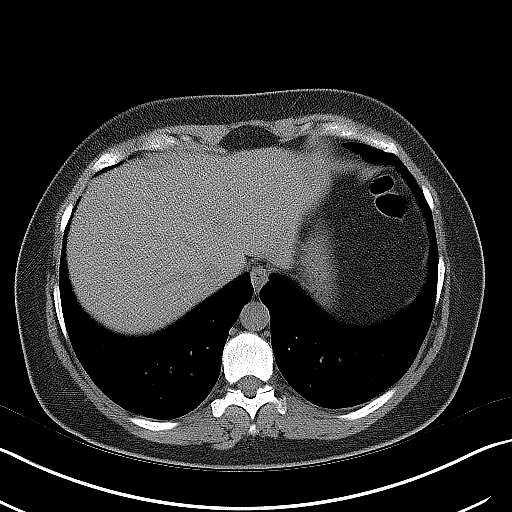
[im 18/32  lung]
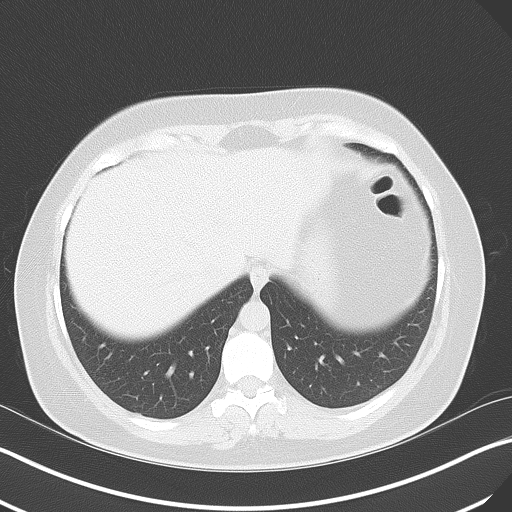
[im 23/32  soft-tissue]
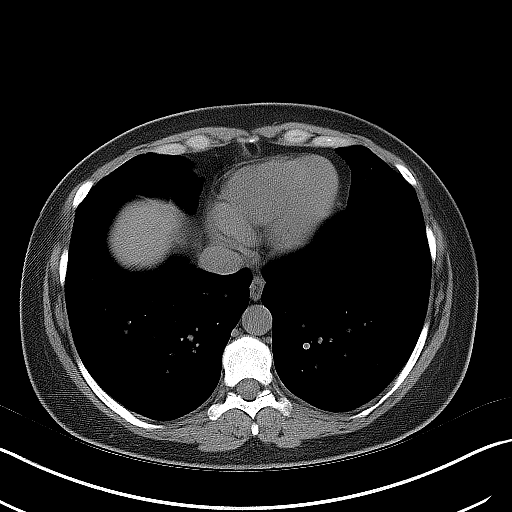
[im 23/32  lung]
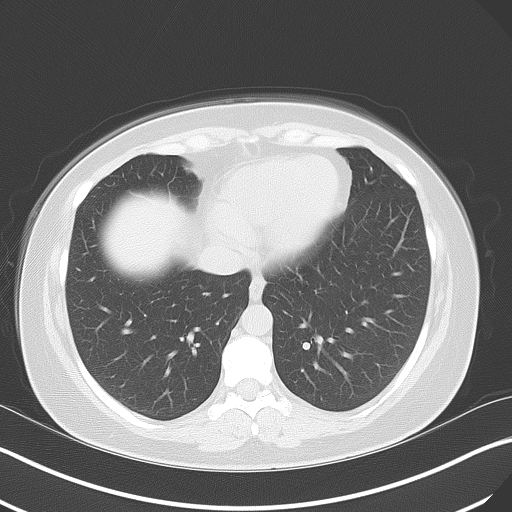
[im 27/32  soft-tissue]
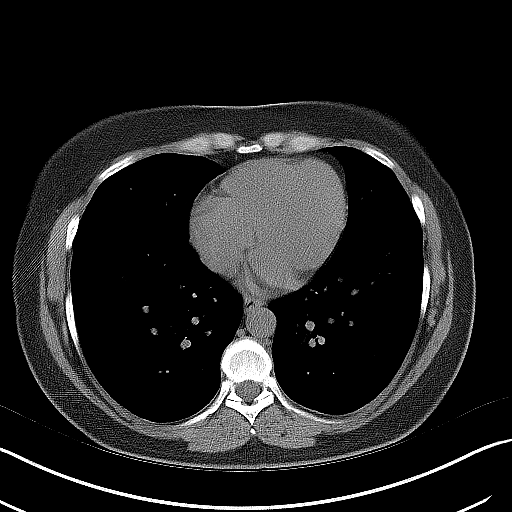
[im 27/32  lung]
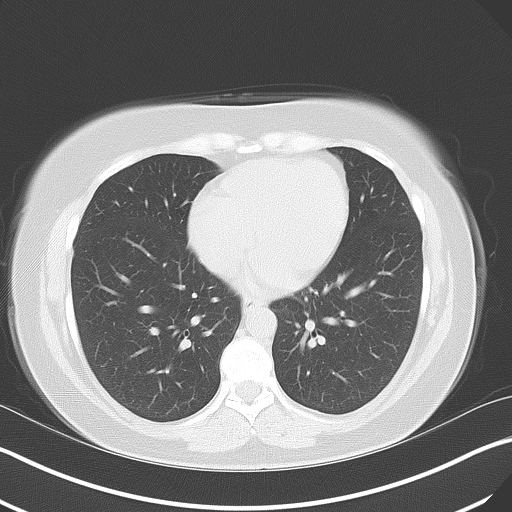

[Series 5: coronal · coronal · 0.75mm/px · 3 of 139 slices shown]
[im 47/139  soft-tissue]
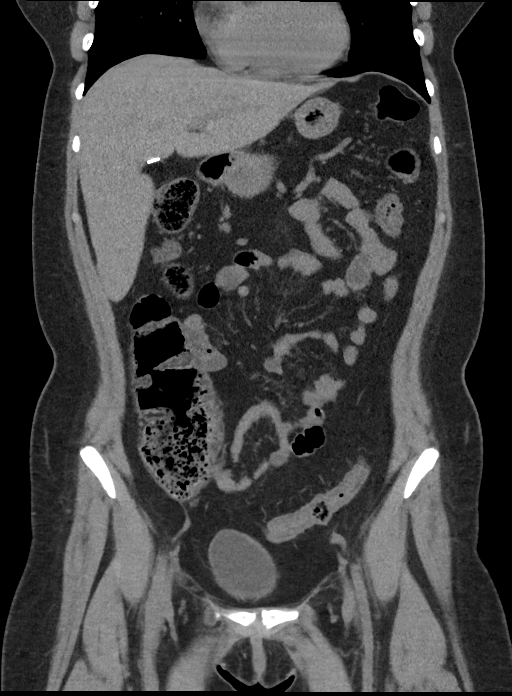
[im 62/139  soft-tissue]
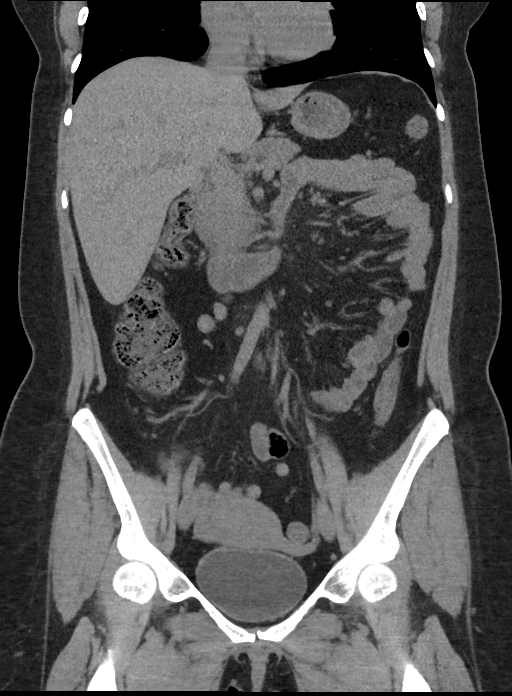
[im 77/139  soft-tissue]
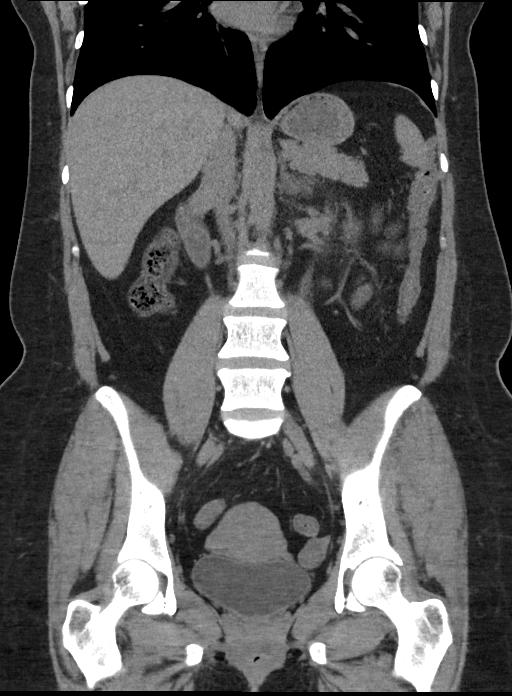

[Series 6: sagittal · sagittal · 0.54mm/px · 1 of 193 slices shown]
[im 65/193  soft-tissue]
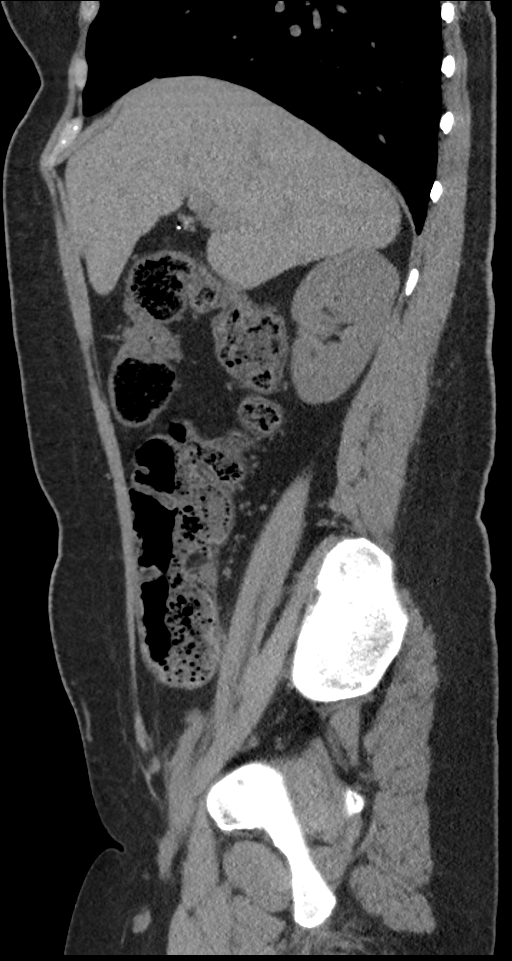

[10 of 46 positions shown; findings below may reference images not displayed]

FINDINGS: Lower chest: Lung bases are clear.

Hepatobiliary: No focal liver abnormality is seen. Status post
cholecystectomy. No biliary dilatation.

Pancreas: No ductal dilatation or inflammation.

Spleen: Normal in size without focal abnormality.

Adrenals/Urinary Tract: No adrenal nodule. Mild left
hydroureteronephrosis. Probable punctate obstructing stone at the
left ureterovesicular junction, best appreciated on coronal reformat
image 84. No right hydronephrosis or hydroureter. No additional
urolithiasis. Urinary bladder is physiologically distended without
wall thickening.

Stomach/Bowel: Stomach is within normal limits. Appendix appears
normal. No evidence of bowel wall thickening, distention, or
inflammatory changes.

Vascular/Lymphatic: Mild mesenteric haziness with small mesenteric
lymph nodes in the left upper abdomen. The abdominal aorta is normal
caliber.

Reproductive: Uterus and bilateral adnexa are unremarkable.

Other: No free air, free fluid, or intra-abdominal fluid collection.

Musculoskeletal: There are no acute or suspicious osseous
abnormalities.
IMPRESSION: 1. Mild left hydroureteronephrosis with probable punctate
obstructing stone at the left ureterovesicular junction.
2. Mild mesenteric haziness with small mesenteric nodes in the left
upper abdomen, may be reactive or mesenteric panniculitis.

## 2022-03-02 ENCOUNTER — Encounter: Payer: Self-pay | Admitting: Otolaryngology

## 2022-03-04 NOTE — Discharge Instructions (Signed)
[facility] ENDOSCOPIC SINUS SURGERY [facility]  What is Functional Endoscopic Sinus Surgery?  The Surgery involves making the natural openings of the sinuses larger by removing the bony partitions that separate the sinuses from the nasal cavity.  The natural sinus lining is preserved as much as possible to allow the sinuses to resume normal function after the surgery.  In some patients nasal polyps (excessively swollen lining of the sinuses) may be removed to relieve obstruction of the sinus openings.  The surgery is performed through the nose using lighted scopes, which eliminates the need for incisions on the face.  A septoplasty is a different procedure which is sometimes performed with sinus surgery.  It involves straightening the boy partition that separates the two sides of your nose.  A crooked or deviated septum may need repair if is obstructing the sinuses or nasal airflow.  Turbinate reduction is also often performed during sinus surgery.  The turbinates are bony proturberances from the side walls of the nose which swell and can obstruct the nose in patients with sinus and allergy problems.  Their size can be surgically reduced to help relieve nasal obstruction.  What Can Sinus Surgery Do For Me?  Sinus surgery can reduce the frequency of sinus infections requiring antibiotic treatment.  This can provide improvement in nasal congestion, post-nasal drainage, facial pressure and nasal obstruction.  Surgery will NOT prevent you from ever having an infection again, so it usually only for patients who get infections 4 or more times yearly requiring antibiotics, or for infections that do not clear with antibiotics.  It will not cure nasal allergies, so patients with allergies may still require medication to treat their allergies after surgery. Surgery may improve headaches related to sinusitis, however, some people will continue to  require medication to control sinus headaches related to allergies.  Surgery will do nothing for other forms of headache (migraine, tension or cluster).  What Are the Risks of Endoscopic Sinus Surgery?  Current techniques allow surgery to be performed safely with little risk, however, there are rare complications that patients should be aware of.  Because the sinuses are located around the eyes, there is risk of eye injury, including blindness, though again, this would be quite rare. This is usually a result of bleeding behind the eye during surgery, which can effect vision, though there are treatments to protect the vision and prevent permanent injury. More serious complications would include bleeding inside the brain cavity or damage to the brain.This happens when the fluid around the brain leaks out into the sinus cavity.  Again, all of these complications are uncommon, and spinal fluid leaks can be safely managed surgically if they occur.  The most common complication of sinus surgery is bleeding from the nose, which may require packing or cauterization of the nose.  Patients with polyps may experience recurrence of the polyps that would require revision surgery.  Alterations of sense of smell or injury to the tear ducts are also rare complications.   What is the Surgery Like, and what is the Recovery?  The Surgery usually takes a couple of hours to perform, and is usually performed under a general anesthetic (completely asleep).  Patients are usually discharged home after a couple of hours.  Sometimes during surgery it is necessary to pack the nose to control bleeding, and the packing is left in place for 24 - 48 hours, and removed by your surgeon.  If   a septoplasty was performed during the procedure, there is often a splint placed which must be removed after 5-7 days.   Discomfort: Pain is usually mild to moderate, and can be controlled by prescription pain medication or acetaminophen (Tylenol).   Aspirin, Ibuprofen (Advil, Motrin), or Naprosyn (Aleve) should be avoided, as they can cause increased bleeding.  Most patients feel sinus pressure like they have a bad head cold for several days.  Sleeping with your head elevated can help reduce swelling and facial pressure, as can ice packs over the face.  A humidifier may be helpful to keep the mucous and blood from drying in the nose.   Diet: There are no specific diet restrictions, however, you should generally start with clear liquids and a light diet of bland foods because the anesthetic can cause some nausea.  Advance your diet depending on how your stomach feels.  Taking your pain medication with food will often help reduce stomach upset which pain medications can cause.  Nasal Saline Irrigation: It is important to remove blood clots and dried mucous from the nose as it is healing.  This is done by having you irrigate the nose at least 3 - 4 times daily with a salt water solution.  We recommend using NeilMed Sinus Rinse (available at the drug store).  Fill the squeeze bottle with the solution, bend over a sink, and insert the tip of the squeeze bottle into the nose  of an inch.  Point the tip of the squeeze bottle towards the inside corner of the eye on the same side your irrigating.  Squeeze the bottle and gently irrigate the nose.  If you bend forward as you do this, most of the fluid will flow back out of the nose, instead of down your throat.   The solution should be warm, near body temperature, when you irrigate.   Each time you irrigate, you should use a full squeeze bottle.   Note that if you are instructed to use Nasal Steroid Sprays at any time after your surgery, irrigate with saline BEFORE using the steroid spray, so you do not wash it all out of the nose. Another product, Nasal Saline Gel (such as AYR Nasal Saline Gel) can be applied in each nostril 3 - 4 times daily to moisture the nose and reduce scabbing or crusting.  Bleeding:   Bloody drainage from the nose can be expected for several days, and patients are instructed to irrigate their nose frequently with salt water to help remove mucous and blood clots.  The drainage may be dark red or brown, though some fresh blood may be seen intermittently, especially after irrigation.  Do not blow you nose, as bleeding may occur. If you must sneeze, keep your mouth open to allow air to escape through your mouth.  If heavy bleeding occurs: Irrigate the nose with saline to rinse out clots, then spray the nose 3 - 4 times with Afrin Nasal Decongestant Spray.  The spray will constrict the blood vessels to slow bleeding.  Pinch the lower half of your nose shut to apply pressure, and lay down with your head elevated.  Ice packs over the nose may help as well. If bleeding persists despite these measures, you should notify your doctor.  Do not use the Afrin routinely to control nasal congestion after surgery, as it can result in worsening congestion and may affect healing.     Activity: Return to work varies among patients. Most patients will be out   of work at least 5 - 7 days to recover.  Patient may return to work after they are off of narcotic pain medication, and feeling well enough to perform the functions of their job.  Patients must avoid heavy lifting (over 10 pounds) or strenuous physical for 2 weeks after surgery, so your employer may need to assign you to light duty, or keep you out of work longer if light duty is not possible.  NOTE: you should not drive, operate dangerous machinery, do any mentally demanding tasks or make any important legal or financial decisions while on narcotic pain medication and recovering from the general anesthetic.    Call Your Doctor Immediately if You Have Any of the Following: Bleeding that you cannot control with the above measures Loss of vision, double vision, bulging of the eye or black eyes. Fever over 101 degrees Neck stiffness with severe headache,  fever, nausea and change in mental state. You are always encouraged to call anytime with concerns, however, please call with requests for pain medication refills during office hours.  Office Endoscopy: During follow-up visits your doctor will remove any packing or splints that may have been placed and evaluate and clean your sinuses endoscopically.  Topical anesthetic will be used to make this as comfortable as possible, though you may want to take your pain medication prior to the visit.  How often this will need to be done varies from patient to patient.  After complete recovery from the surgery, you may need follow-up endoscopy from time to time, particularly if there is concern of recurrent infection or nasal polyps.  

## 2022-03-11 ENCOUNTER — Other Ambulatory Visit: Payer: Self-pay

## 2022-03-11 ENCOUNTER — Encounter: Payer: Self-pay | Admitting: Otolaryngology

## 2022-03-11 ENCOUNTER — Ambulatory Visit: Payer: Managed Care, Other (non HMO) | Admitting: Anesthesiology

## 2022-03-11 ENCOUNTER — Encounter
Admission: RE | Disposition: A | Discharge status: Home / Self Care | Payer: Self-pay | Source: Home / Self Care | Attending: Otolaryngology

## 2022-03-11 ENCOUNTER — Ambulatory Visit
Admission: RE | Admit: 2022-03-11 | Discharge: 2022-03-11 | Disposition: A | Discharge status: Home / Self Care | Payer: Managed Care, Other (non HMO) | Attending: Otolaryngology | Admitting: Otolaryngology

## 2022-03-11 DIAGNOSIS — J342 Deviated nasal septum: Secondary | ICD-10-CM | POA: Insufficient documentation

## 2022-03-11 DIAGNOSIS — J343 Hypertrophy of nasal turbinates: Secondary | ICD-10-CM | POA: Insufficient documentation

## 2022-03-11 DIAGNOSIS — J320 Chronic maxillary sinusitis: Secondary | ICD-10-CM | POA: Diagnosis present

## 2022-03-11 HISTORY — DX: Presence of spectacles and contact lenses: Z97.3

## 2022-03-11 HISTORY — PX: SEPTOPLASTY: SHX2393

## 2022-03-11 HISTORY — PX: MAXILLARY ANTROSTOMY: SHX2003

## 2022-03-11 HISTORY — PX: NASAL TURBINATE REDUCTION: SHX2072

## 2022-03-11 HISTORY — DX: Migraine, unspecified, not intractable, without status migrainosus: G43.909

## 2022-03-11 HISTORY — PX: IMAGE GUIDED SINUS SURGERY: SHX6570

## 2022-03-11 LAB — POCT PREGNANCY, URINE: Preg Test, Ur: NEGATIVE

## 2022-03-11 SURGERY — SINUS SURGERY, WITH IMAGING GUIDANCE
Anesthesia: General | Site: Nose

## 2022-03-11 MED ORDER — DEXAMETHASONE SODIUM PHOSPHATE 4 MG/ML IJ SOLN
INTRAMUSCULAR | Status: DC | PRN
  Administered 2022-03-11: 10 mg via INTRAVENOUS

## 2022-03-11 MED ORDER — MIDAZOLAM HCL 5 MG/5ML IJ SOLN
INTRAMUSCULAR | Status: DC | PRN
  Administered 2022-03-11: 2 mg via INTRAVENOUS

## 2022-03-11 MED ORDER — DEXTROSE 5 % IV SOLN
2000.0000 mg | Freq: Once | INTRAVENOUS | Status: AC
  Administered 2022-03-11: 2 g via INTRAVENOUS

## 2022-03-11 MED ORDER — LIDOCAINE HCL 4 % EX SOLN
CUTANEOUS | Status: DC | PRN
  Administered 2022-03-11: 2 mL via TOPICAL

## 2022-03-11 MED ORDER — OXYCODONE HCL 5 MG PO TABS: 5.0000 mg | ORAL_TABLET | Freq: Once | ORAL | Status: AC | PRN

## 2022-03-11 MED ORDER — ACETAMINOPHEN 160 MG/5ML PO SOLN: 960.0000 mg | Freq: Once | ORAL | Status: AC

## 2022-03-11 MED ORDER — LIDOCAINE-EPINEPHRINE 1 %-1:100000 IJ SOLN
INTRAMUSCULAR | Status: DC | PRN
  Administered 2022-03-11: 1.5 mL
  Administered 2022-03-11: 8 mL

## 2022-03-11 MED ORDER — ONDANSETRON HCL 4 MG/2ML IJ SOLN
INTRAMUSCULAR | Status: DC | PRN
  Administered 2022-03-11: 4 mg via INTRAVENOUS

## 2022-03-11 MED ORDER — SCOPOLAMINE 1 MG/3DAYS TD PT72
1.0000 | MEDICATED_PATCH | Freq: Once | TRANSDERMAL | Status: DC
  Administered 2022-03-11: 1.5 mg via TRANSDERMAL

## 2022-03-11 MED ORDER — PREDNISONE 10 MG PO TABS: ORAL_TABLET | ORAL | 0 refills | Status: AC

## 2022-03-11 MED ORDER — EPHEDRINE SULFATE (PRESSORS) 50 MG/ML IJ SOLN
INTRAMUSCULAR | Status: DC | PRN
  Administered 2022-03-11 (×5): 5 mg via INTRAVENOUS
  Administered 2022-03-11: 10 mg via INTRAVENOUS
  Administered 2022-03-11: 5 mg via INTRAVENOUS

## 2022-03-11 MED ORDER — PHENYLEPHRINE HCL 0.5 % NA SOLN
NASAL | Status: DC | PRN
  Administered 2022-03-11: 15 mL via TOPICAL

## 2022-03-11 MED ORDER — ONDANSETRON HCL 4 MG/2ML IJ SOLN
4.0000 mg | Freq: Once | INTRAMUSCULAR | Status: AC | PRN
  Administered 2022-03-11: 4 mg via INTRAVENOUS

## 2022-03-11 MED ORDER — CEPHALEXIN 500 MG PO CAPS: 500.0000 mg | ORAL_CAPSULE | Freq: Two times a day (BID) | ORAL | 0 refills | Status: AC

## 2022-03-11 MED ORDER — DEXMEDETOMIDINE HCL IN NACL 80 MCG/20ML IV SOLN
INTRAVENOUS | Status: DC | PRN
  Administered 2022-03-11: 8 ug via BUCCAL

## 2022-03-11 MED ORDER — HYDROCODONE-ACETAMINOPHEN 5-325 MG PO TABS: 1.0000 | ORAL_TABLET | Freq: Four times a day (QID) | ORAL | 0 refills | Status: AC | PRN

## 2022-03-11 MED ORDER — LACTATED RINGERS IV SOLN: INTRAVENOUS | Status: DC

## 2022-03-11 MED ORDER — LIDOCAINE HCL (CARDIAC) PF 100 MG/5ML IV SOSY
PREFILLED_SYRINGE | INTRAVENOUS | Status: DC | PRN
  Administered 2022-03-11: 50 mg via INTRAVENOUS

## 2022-03-11 MED ORDER — OXYMETAZOLINE HCL 0.05 % NA SOLN
2.0000 | Freq: Once | NASAL | Status: AC
  Administered 2022-03-11: 2 via NASAL

## 2022-03-11 MED ORDER — FENTANYL CITRATE (PF) 100 MCG/2ML IJ SOLN
INTRAMUSCULAR | Status: DC | PRN
  Administered 2022-03-11 (×2): 50 ug via INTRAVENOUS

## 2022-03-11 MED ORDER — OXYCODONE HCL 5 MG/5ML PO SOLN
5.0000 mg | Freq: Once | ORAL | Status: AC | PRN
  Administered 2022-03-11: 5 mg via ORAL

## 2022-03-11 MED ORDER — FENTANYL CITRATE PF 50 MCG/ML IJ SOSY: 25.0000 ug | PREFILLED_SYRINGE | INTRAMUSCULAR | Status: DC | PRN

## 2022-03-11 MED ORDER — SUCCINYLCHOLINE CHLORIDE 200 MG/10ML IV SOSY
PREFILLED_SYRINGE | INTRAVENOUS | Status: DC | PRN
  Administered 2022-03-11: 100 mg via INTRAVENOUS

## 2022-03-11 MED ORDER — PROPOFOL 10 MG/ML IV BOLUS
INTRAVENOUS | Status: DC | PRN
  Administered 2022-03-11: 200 mg via INTRAVENOUS

## 2022-03-11 MED ORDER — ACETAMINOPHEN 500 MG PO TABS
1000.0000 mg | ORAL_TABLET | Freq: Once | ORAL | Status: AC
  Administered 2022-03-11: 1000 mg via ORAL

## 2022-03-11 MED ORDER — ACETAMINOPHEN 10 MG/ML IV SOLN
INTRAVENOUS | Status: DC | PRN
  Administered 2022-03-11: 1000 mg via INTRAVENOUS

## 2022-03-11 SURGICAL SUPPLY — 37 items
BLADE SHAVER TRUDI 4 15 DEG (ENT DISPOSABLE) IMPLANT
BLADE SHAVER TRUDI STR 4 (ENT DISPOSABLE) ×2 IMPLANT
CABLE TRUDI DISPOSABLE (ENT DISPOSABLE) ×4 IMPLANT
CANISTER SUCT 1200ML W/VALVE (MISCELLANEOUS) ×2 IMPLANT
CATH IV 18X1 1/4 SAFELET (CATHETERS) ×2 IMPLANT
COAGULATOR SUCT 8FR VV (MISCELLANEOUS) ×2 IMPLANT
ELECT REM PT RETURN 9FT ADLT (ELECTROSURGICAL) ×2
ELECTRODE REM PT RTRN 9FT ADLT (ELECTROSURGICAL) ×2 IMPLANT
GLOVE SURG GAMMEX PI TX LF 7.5 (GLOVE) ×4 IMPLANT
GOWN STRL REUS W/ TWL LRG LVL3 (GOWN DISPOSABLE) ×2 IMPLANT
GOWN STRL REUS W/TWL LRG LVL3 (GOWN DISPOSABLE) ×2
IV CATH 18X1 1/4 SAFELET (CATHETERS) ×2
IV NS 500ML (IV SOLUTION) ×2
IV NS 500ML BAXH (IV SOLUTION) ×2 IMPLANT
KIT TURNOVER KIT A (KITS) ×2 IMPLANT
NDL ANESTHESIA 27G X 3.5 (NEEDLE) ×2 IMPLANT
NDL HYPO 27GX1-1/4 (NEEDLE) ×2 IMPLANT
NEEDLE ANESTHESIA  27G X 3.5 (NEEDLE) ×2
NEEDLE ANESTHESIA 27G X 3.5 (NEEDLE) ×2 IMPLANT
NEEDLE HYPO 27GX1-1/4 (NEEDLE) ×2 IMPLANT
NS IRRIG 500ML POUR BTL (IV SOLUTION) ×2 IMPLANT
PACK ENT CUSTOM (PACKS) ×2 IMPLANT
PACKING NASAL EPIS 4X2.4 XEROG (MISCELLANEOUS) ×4 IMPLANT
PATTIES SURGICAL .5 X3 (DISPOSABLE) ×2 IMPLANT
SOL ANTI-FOG 6CC FOG-OUT (MISCELLANEOUS) ×2 IMPLANT
SPLINT NASAL SEPTAL BLV .50 ST (MISCELLANEOUS) ×2 IMPLANT
STRAP BODY AND KNEE 60X3 (MISCELLANEOUS) ×2 IMPLANT
SUT CHROMIC 3-0 (SUTURE) ×2
SUT CHROMIC 3-0 KS 27XMFL CR (SUTURE) ×2
SUT ETHILON 3-0 KS 30 BLK (SUTURE) ×2 IMPLANT
SUT PLAIN GUT 4-0 (SUTURE) ×2 IMPLANT
SUTURE CHRMC 3-0 KS 27XMFL CR (SUTURE) IMPLANT
SYR 3ML LL SCALE MARK (SYRINGE) ×2 IMPLANT
TOWEL OR 17X26 4PK STRL BLUE (TOWEL DISPOSABLE) ×2 IMPLANT
TRACKER DISPOSABLE PAITIENT (MISCELLANEOUS) ×2 IMPLANT
TUBING IRRIGATION BIEN-AIR (TUBING) ×2 IMPLANT
WATER STERILE IRR 250ML POUR (IV SOLUTION) ×2 IMPLANT

## 2022-03-11 NOTE — Transfer of Care (Signed)
Immediate Anesthesia Transfer of Care Note  Patient: Vickie Gilbert  Procedure(s) Performed: IMAGE GUIDED SINUS SURGERY (Bilateral: Nose) SEPTOPLASTY (Nose) INFERIOR TURBINATE REDUCTION (Bilateral: Nose) RIGHT MAXILLARY ANTROSTOMY, LEFT MAXILLARY ANTROSTOMYWITH TISSUE REMOVAL (Bilateral: Nose)  Patient Location: PACU  Anesthesia Type: General  Level of Consciousness: awake, alert  and patient cooperative  Airway and Oxygen Therapy: Patient Spontanous Breathing and Patient connected to supplemental oxygen  Post-op Assessment: Post-op Vital signs reviewed, Patient's Cardiovascular Status Stable, Respiratory Function Stable, Patent Airway and No signs of Nausea or vomiting  Post-op Vital Signs: Reviewed and stable  Complications: No notable events documented.

## 2022-03-11 NOTE — Op Note (Signed)
03/11/2022  11:13 AM  [id number]   Pre-Op Dx:  Deviated Nasal Septum, Hypertrophic Inferior Turbinates, bilateral Chronic maxillary sinusitis  Post-op Dx: Same  Proc: Nasal Septoplasty, Bilateral Partial Reduction Inferior Turbinates, Left endoscopic maxillary antrostomy with removal of contents, right endoscopic maxillary antrostomy, use of image guided system   Surg:  Elon Alas Aamari Strawderman  Anes:  GOT  EBL: 75 mL  Comp: None  Findings: Polypoid changes of the anterior border of the middle turbinate on both sides that were trimmed off.  The left maxillary sinus was filled mostly with a large glob of thick inspissated mucus that was like rubber cement.  The septum was markedly deviated to her left side.  Procedure: With the patient in a comfortable supine position,  general orotracheal anesthesia was induced without difficulty.     The patient received preoperative Afrin spray for topical decongestion and vasoconstriction.  Intravenous prophylactic antibiotics were administered.  At an appropriate level, the patient was placed in a semi-sitting position.  Nasal vibrissae were trimmed.   1% Xylocaine with 1:100,000 epinephrine, 8 cc's, was infiltrated into the anterior floor of the nose, into the nasal spine region, into the membranous columella, and finally into the submucoperichondrial plane of the septum on both sides.  Several minutes were allowed for this to take effect.  Cottoniod pledgetts soaked in Afrin and 4% Xylocaine were placed into both nasal cavities and left while the patient was prepped and draped in the standard fashion.  The image guided system was brought in and the CT scan was downloaded to the system.  The patient was placed on the standard image guided head board in the correct position and then the face was registered to the system.  There appeared to be good alignment with the system involving the front of the face and the anterior areas in the nose but has gotten more  posterior there is about 1/4 inch anterior to posterior variation at the posterior nasopharynx.  The materials were removed from the nose and observed to be intact and correct in number.  The nose was inspected with a headlight and zero degree scope with the findings as described above.  A left Killian incision was sharply executed and carried down to the quadrangular cartilage. The mucoperichondrium was elelvated along the quadrangular plate back to the bony-cartilaginous junction. The mucoperiostium was then elevated along the ethmoid plate and the vomer. The boney-catilaginous junction was then split with a freer elevator and the mucoperiosteum was elevated on the opposite side. The mucoperiosteum was then elevated along the maxillary crest as needed to expose the crooked bone of the crest.  Boney spurs of the vomer and maxillary crest were removed with Donavan Foil forceps.  The cartilaginous plate was trimmed along its posterior and inferior borders of about 2 mm of cartilage to free it up inferiorly. Some of the deviated ethmoid plate was then fractured and removed with Takahashi forceps to free up the posterior border of the quadrangular plate and allow it to swing back to the midline. The mucosal flaps were placed back into their anatomic position to allow visualization of the airways. The septum now sat in the midline with an improved airway.  A 3-0 Chromic suture on a Keith needle in used to anchor the inferior septum at the nasal spine with a through and through suture. The mucosal flaps are then sutured together using a through and through whip stitch of 4-0 Plain Gut with a mini-Keith needle. This was used to close  the [place] incision as well.   The inferior turbinates were then inspected. An incision was created along the inferior aspect of the left inferior turbinate with removal of some of the inferior soft tissue and bone. Electrocautery was used to control bleeding in the area. The  remaining turbinate was then outfractured to open up the airway further. There was no significant bleeding noted. The right turbinate was then trimmed and outfractured in a similar fashion.  The 0 degree scope was then used to visualize the left side of the nose.  The left middle turbinate was way lateralized because of the previous deviation of the septum.  This was medialized and some of the polypoid tissue at the anterior superior surface was trimmed away.  The middle meatus was now more clearly visible.  He could see the medial wall of the maxillary sinus was indented where it had never filled out and grown out.  The uncinate process was flat up against the superior medial wall of the maxillary sinus and then rested against the uncinate process as well.  This was peeled away and removed.  This opened up the maxillary antrum.  The uncinate process was completely removed which was stuck to the anterior medial wall of the maxillary sinus.  Once the maxillary sinus was opened the 30 degree scope was used to visualize the area.  There appeared to be a second membrane that was a bony membrane that was dividing the upper third and lower two thirds of the maxillary sinus.  This was removed as much possible using the microdebrider to trim this thin bone and mucous membrane.  Once I opened this up there was what appeared to be a very large whitish polyp in the maxillary sinus but this all was thick inspissated mucus that had not been able to get out.  I was able to grab the front end of this with suction and removed a large glob of mucus that was about 5 cm in length and 3 cm in diameter.  The mucous membrane underneath it looked pretty healthy and that membrane that was separating the upper and lower maxillary sinus was removed to create 1 large opening.  There is a small Haller cell superiorly and this was opened and cleaned to make sure this would not cause problems in the future.  This completed opening of the left  maxillary sinus and there was no significant bleeding.  The 0 degree scope was then used on the right side to visualize the airway.  The middle turbinate had polypoid changes again along the anterior border and these were trimmed.  There was paradoxical curve inferiorly which almost made it look like there were 2 turbinates along the medial wall.  The lower portion of the paradoxical curve was removed to create a single, upside down comma shaped middle turbinate.  The middle meatus was better seen.  The uncinate process was then incised.  This is very thickened with a bullosa in the uncinate process.  The entire uncinate was removed and this opened up the natural ostium.  This was opened and widened to remove some of the thickened mucous membrane here that was blocking this.  30 degree scope was used to make sure the natural ostium was completely opened and the maxillary sinus was clear.  The microdebrider was used along with this to clean up the edges and the image guided system was used to make sure that this was completely open.  Left side was visualized again  there is no further bleeding.  The maxillary sinus was wide open and the middle meatus was clear.  Xerogel was then placed into the middle meatus and around the opening to the maxillary antrum.  This covered over some of the trimmed middle turbinate as well.  The right side was then visualized and also had no significant bleeding.  Xerogel was used to fill the middle meatus cover over the middle turbinate and cover the opening to the maxillary antrum.  The airways were then visualized and showed open passageways on both sides that were significantly improved compared to before surgery. There was no signifcant bleeding. Nasal splints were applied to both sides of the septum using Xomed 0.47mm regular sized splints that were trimmed, and then held in position with a 3-0 Nylon through and through suture.  The patient was turned back over to anesthesia,  and awakened, extubated, and taken to the PACU in satisfactory condition.  Dispo:   PACU to home  Plan: Ice, elevation, narcotic analgesia, steroid taper, and prophylactic antibiotics for the duration of indwelling nasal foreign bodies.  We will reevaluate the patient in the office in 6 days and remove the septal splints.  Return to work in 10 days, strenuous activities in two weeks.   Beverly Sessions Eman Morimoto 03/11/2022 11:13 AM

## 2022-03-11 NOTE — Anesthesia Postprocedure Evaluation (Signed)
Anesthesia Post Note  Patient: Vickie Gilbert  Procedure(s) Performed: IMAGE GUIDED SINUS SURGERY (Bilateral: Nose) SEPTOPLASTY (Nose) INFERIOR TURBINATE REDUCTION (Bilateral: Nose) RIGHT MAXILLARY ANTROSTOMY, LEFT MAXILLARY ANTROSTOMYWITH TISSUE REMOVAL (Bilateral: Nose)  Patient location during evaluation: PACU Anesthesia Type: General Level of consciousness: awake and alert Pain management: pain level controlled Vital Signs Assessment: post-procedure vital signs reviewed and stable Respiratory status: spontaneous breathing, nonlabored ventilation, respiratory function stable and patient connected to nasal cannula oxygen Cardiovascular status: blood pressure returned to baseline and stable Postop Assessment: no apparent nausea or vomiting Anesthetic complications: no   No notable events documented.   Last Vitals:  Vitals:   03/11/22 1120 03/11/22 1130  BP: 114/64 114/84  Pulse: 90 90  Resp: 14 16  Temp: (!) 36.3 C (!) 36.3 C  SpO2: 95% 98%    Last Pain:  Vitals:   03/11/22 1130  TempSrc:   PainSc: Asleep                 Arita Miss

## 2022-03-11 NOTE — Anesthesia Procedure Notes (Signed)
Procedure Name: Intubation Date/Time: 03/11/2022 9:16 AM  Performed by: Londell Moh, CRNAPre-anesthesia Checklist: Patient identified, Emergency Drugs available, Suction available, Patient being monitored and Timeout performed Patient Re-evaluated:Patient Re-evaluated prior to induction Oxygen Delivery Method: Circle system utilized Preoxygenation: Pre-oxygenation with 100% oxygen Induction Type: IV induction Ventilation: Mask ventilation without difficulty Laryngoscope Size: Mac and 3 Grade View: Grade I Tube type: Oral Rae Tube size: 7.0 mm Number of attempts: 1 Placement Confirmation: ETT inserted through vocal cords under direct vision, positive ETCO2 and breath sounds checked- equal and bilateral Tube secured with: Tape Dental Injury: Teeth and Oropharynx as per pre-operative assessment

## 2022-03-11 NOTE — H&P (Signed)
H&P has been reviewed and patient reevaluated, no changes necessary. To be downloaded later.  

## 2022-03-11 NOTE — Anesthesia Preprocedure Evaluation (Signed)
Anesthesia Evaluation  Patient identified by MRN, date of birth, ID band Patient awake    Reviewed: Allergy & Precautions, NPO status , Patient's Chart, lab work & pertinent test results  History of Anesthesia Complications (+) PONV and history of anesthetic complications  Airway Mallampati: II  TM Distance: >3 FB Neck ROM: Full    Dental no notable dental hx. (+) Teeth Intact   Pulmonary asthma , neg sleep apnea, neg COPD, Patient abstained from smoking.Not current smoker Mild exercise induced asthma, does not need to take inhalers regularly   Pulmonary exam normal breath sounds clear to auscultation       Cardiovascular Exercise Tolerance: Good METS(-) hypertension(-) CAD and (-) Past MI negative cardio ROS (-) dysrhythmias  Rhythm:Regular Rate:Normal - Systolic murmurs    Neuro/Psych  Headaches PSYCHIATRIC DISORDERS Anxiety Depression       GI/Hepatic ,neg GERD  ,,(+)     (-) substance abuse    Endo/Other  neg diabetes    Renal/GU negative Renal ROS     Musculoskeletal   Abdominal  (+) + obese  Peds  Hematology   Anesthesia Other Findings Past Medical History: No date: Anxiety No date: Depression 04/2017: History of kidney stones No date: Migraine headache No date: Thyroid disease     Comment:  hypothyroidism  No date: Wears contact lenses  Reproductive/Obstetrics                              Anesthesia Physical Anesthesia Plan  ASA: 2  Anesthesia Plan: General   Post-op Pain Management: Tylenol PO (pre-op) and Fentanyl IV   Induction: Intravenous  PONV Risk Score and Plan: 4 or greater and Ondansetron, Dexamethasone, Midazolam and Scopolamine patch - Pre-op  Airway Management Planned: Oral ETT  Additional Equipment: None  Intra-op Plan:   Post-operative Plan: Extubation in OR  Informed Consent: I have reviewed the patients History and Physical, chart, labs and  discussed the procedure including the risks, benefits and alternatives for the proposed anesthesia with the patient or authorized representative who has indicated his/her understanding and acceptance.     Dental advisory given  Plan Discussed with: CRNA and Surgeon  Anesthesia Plan Comments: (Discussed risks of anesthesia with patient, including PONV, sore throat, lip/dental/eye damage. Rare risks discussed as well, such as cardiorespiratory and neurological sequelae, and allergic reactions. Discussed the role of CRNA in patient's perioperative care. Patient understands.)         Anesthesia Quick Evaluation

## 2022-03-12 ENCOUNTER — Encounter: Payer: Self-pay | Admitting: Otolaryngology

## 2022-03-16 LAB — SURGICAL PATHOLOGY

## 2022-08-10 ENCOUNTER — Other Ambulatory Visit: Payer: Self-pay | Admitting: Obstetrics and Gynecology

## 2022-08-10 DIAGNOSIS — Z1231 Encounter for screening mammogram for malignant neoplasm of breast: Secondary | ICD-10-CM

## 2022-10-28 ENCOUNTER — Ambulatory Visit: Payer: Managed Care, Other (non HMO)

## 2022-10-28 DIAGNOSIS — K640 First degree hemorrhoids: Secondary | ICD-10-CM | POA: Diagnosis not present

## 2022-10-28 DIAGNOSIS — R197 Diarrhea, unspecified: Secondary | ICD-10-CM | POA: Diagnosis not present

## 2022-10-28 DIAGNOSIS — Z09 Encounter for follow-up examination after completed treatment for conditions other than malignant neoplasm: Secondary | ICD-10-CM | POA: Diagnosis present

## 2022-10-28 DIAGNOSIS — Z8719 Personal history of other diseases of the digestive system: Secondary | ICD-10-CM | POA: Diagnosis not present

## 2022-10-28 DIAGNOSIS — K2950 Unspecified chronic gastritis without bleeding: Secondary | ICD-10-CM | POA: Diagnosis not present

## 2024-03-09 ENCOUNTER — Ambulatory Visit
Admission: RE | Admit: 2024-03-09 | Discharge: 2024-03-09 | Disposition: A | Discharge status: Home / Self Care | Source: Ambulatory Visit | Attending: Rheumatology | Admitting: Rheumatology

## 2024-03-09 ENCOUNTER — Other Ambulatory Visit: Payer: Self-pay | Admitting: Rheumatology

## 2024-03-09 DIAGNOSIS — M1380 Other specified arthritis, unspecified site: Secondary | ICD-10-CM
